# Patient Record
Sex: Male | Born: 1950 | Race: Black or African American | Hispanic: No | Marital: Single | Smoking: Current every day smoker
Health system: Southern US, Community
[De-identification: ages and names within clinical notes are randomized; demographics above are authoritative.]

## PROBLEM LIST (undated history)

## (undated) ENCOUNTER — Emergency Department: Payer: Medicare Other

## (undated) DIAGNOSIS — F1010 Alcohol abuse, uncomplicated: Secondary | ICD-10-CM

## (undated) DIAGNOSIS — K759 Inflammatory liver disease, unspecified: Secondary | ICD-10-CM

## (undated) DIAGNOSIS — I10 Essential (primary) hypertension: Secondary | ICD-10-CM

## (undated) HISTORY — PX: HIP SURGERY: SHX245

---

## 2008-12-07 ENCOUNTER — Emergency Department: Payer: Self-pay | Admitting: Emergency Medicine

## 2009-09-04 ENCOUNTER — Emergency Department: Payer: Self-pay | Admitting: Emergency Medicine

## 2010-05-24 ENCOUNTER — Emergency Department: Payer: Self-pay | Admitting: Emergency Medicine

## 2010-10-04 ENCOUNTER — Emergency Department: Payer: Self-pay | Admitting: Internal Medicine

## 2011-02-08 LAB — URINALYSIS, COMPLETE
Bilirubin,UR: NEGATIVE
Blood: NEGATIVE
Leukocyte Esterase: NEGATIVE
Nitrite: NEGATIVE
Protein: NEGATIVE
RBC,UR: NONE SEEN /HPF (ref 0–5)
Specific Gravity: 1.002 (ref 1.003–1.030)
Squamous Epithelial: <1
WBC UR: <1 /HPF (ref 0–5)

## 2011-02-08 LAB — DRUG SCREEN, URINE
Amphetamines, Ur Screen: NEGATIVE (ref ?–1000)
Benzodiazepine, Ur Scrn: NEGATIVE (ref ?–200)
Cannabinoid 50 Ng, Ur ~~LOC~~: NEGATIVE (ref ?–50)
Methadone, Ur Screen: NEGATIVE (ref ?–300)
Phencyclidine (PCP) Ur S: NEGATIVE (ref ?–25)
Tricyclic, Ur Screen: NEGATIVE (ref ?–1000)

## 2011-02-08 LAB — COMPREHENSIVE METABOLIC PANEL
Alkaline Phosphatase: 48 U/L — ABNORMAL LOW (ref 50–136)
Anion Gap: 12 (ref 7–16)
BUN: 8 mg/dL (ref 7–18)
Bilirubin,Total: 0.2 mg/dL (ref 0.2–1.0)
Calcium, Total: 8.4 mg/dL — ABNORMAL LOW (ref 8.5–10.1)
Chloride: 106 mmol/L (ref 98–107)
Co2: 25 mmol/L (ref 21–32)
EGFR (African American): >60
EGFR (Non-African Amer.): >60
Glucose: 110 mg/dL — ABNORMAL HIGH (ref 65–99)
Potassium: 4.4 mmol/L (ref 3.5–5.1)
SGOT(AST): 36 U/L (ref 15–37)
SGPT (ALT): 35 U/L

## 2011-02-08 LAB — ACETAMINOPHEN LEVEL: Acetaminophen: <2 ug/mL

## 2011-02-08 LAB — ETHANOL
Ethanol %: 0.371 % (ref 0.000–0.080)
Ethanol: 371 mg/dL
Ethanol: 259 mg/dL

## 2011-02-08 LAB — CBC
HCT: 41.9 % (ref 40.0–52.0)
HGB: 13.8 g/dL (ref 13.0–18.0)
MCH: 33.4 pg (ref 26.0–34.0)
Platelet: 316 10*3/uL (ref 150–440)
RBC: 4.12 10*6/uL — ABNORMAL LOW (ref 4.40–5.90)
WBC: 5.2 10*3/uL (ref 3.8–10.6)

## 2011-02-08 LAB — TSH: Thyroid Stimulating Horm: 0.71 u[IU]/mL

## 2011-02-09 ENCOUNTER — Inpatient Hospital Stay: Payer: Self-pay | Admitting: Psychiatry

## 2011-03-18 ENCOUNTER — Emergency Department: Payer: Self-pay | Admitting: Emergency Medicine

## 2011-03-18 LAB — COMPREHENSIVE METABOLIC PANEL
Albumin: 4.1 g/dL (ref 3.4–5.0)
Alkaline Phosphatase: 62 U/L (ref 50–136)
Calcium, Total: 8.8 mg/dL (ref 8.5–10.1)
Chloride: 105 mmol/L (ref 98–107)
Co2: 24 mmol/L (ref 21–32)
EGFR (Non-African Amer.): >60
Osmolality: 285 (ref 275–301)
Potassium: 4.4 mmol/L (ref 3.5–5.1)
SGOT(AST): 47 U/L — ABNORMAL HIGH (ref 15–37)
SGPT (ALT): 41 U/L
Total Protein: 8.4 g/dL — ABNORMAL HIGH (ref 6.4–8.2)

## 2011-03-18 LAB — DRUG SCREEN, URINE
Barbiturates, Ur Screen: NEGATIVE (ref ?–200)
Benzodiazepine, Ur Scrn: NEGATIVE (ref ?–200)
MDMA (Ecstasy)Ur Screen: NEGATIVE (ref ?–500)
Methadone, Ur Screen: NEGATIVE (ref ?–300)
Phencyclidine (PCP) Ur S: NEGATIVE (ref ?–25)

## 2011-03-18 LAB — ACETAMINOPHEN LEVEL: Acetaminophen: <2 ug/mL

## 2011-03-18 LAB — CBC
MCH: 32.2 pg (ref 26.0–34.0)
MCHC: 33.3 g/dL (ref 32.0–36.0)
MCV: 97 fL (ref 80–100)
Platelet: 277 10*3/uL (ref 150–440)
RDW: 13.9 % (ref 11.5–14.5)
WBC: 10.8 10*3/uL — ABNORMAL HIGH (ref 3.8–10.6)

## 2011-03-18 LAB — SALICYLATE LEVEL: Salicylates, Serum: 2 mg/dL

## 2011-03-18 LAB — URINALYSIS, COMPLETE
Bilirubin,UR: NEGATIVE
Blood: NEGATIVE
Hyaline Cast: 3
Ketone: NEGATIVE
Nitrite: NEGATIVE
Ph: 5 (ref 4.5–8.0)
Protein: NEGATIVE
RBC,UR: NONE SEEN /HPF (ref 0–5)
Squamous Epithelial: 1

## 2011-03-18 LAB — ETHANOL
Ethanol %: 0.358 % (ref 0.000–0.080)
Ethanol: 287 mg/dL

## 2012-04-14 ENCOUNTER — Emergency Department: Payer: Self-pay | Admitting: Internal Medicine

## 2012-04-14 LAB — BASIC METABOLIC PANEL
Anion Gap: 9 (ref 7–16)
BUN: 11 mg/dL (ref 7–18)
Co2: 23 mmol/L (ref 21–32)
EGFR (African American): >60
Glucose: 122 mg/dL — ABNORMAL HIGH (ref 65–99)
Potassium: 4 mmol/L (ref 3.5–5.1)
Sodium: 137 mmol/L (ref 136–145)

## 2012-04-14 LAB — DRUG SCREEN, URINE
Amphetamines, Ur Screen: NEGATIVE (ref ?–1000)
Cannabinoid 50 Ng, Ur ~~LOC~~: POSITIVE (ref ?–50)
Cocaine Metabolite,Ur ~~LOC~~: POSITIVE (ref ?–300)
MDMA (Ecstasy)Ur Screen: NEGATIVE (ref ?–500)
Opiate, Ur Screen: NEGATIVE (ref ?–300)
Phencyclidine (PCP) Ur S: NEGATIVE (ref ?–25)
Tricyclic, Ur Screen: NEGATIVE (ref ?–1000)

## 2012-04-14 LAB — CBC
HGB: 13.4 g/dL (ref 13.0–18.0)
MCH: 32.5 pg (ref 26.0–34.0)
MCHC: 33.6 g/dL (ref 32.0–36.0)
MCV: 97 fL (ref 80–100)
RBC: 4.13 10*6/uL — ABNORMAL LOW (ref 4.40–5.90)
RDW: 15.3 % — ABNORMAL HIGH (ref 11.5–14.5)
WBC: 4.8 10*3/uL (ref 3.8–10.6)

## 2012-04-14 LAB — ETHANOL
Ethanol %: 0.309 % (ref 0.000–0.080)
Ethanol: 309 mg/dL

## 2012-06-07 ENCOUNTER — Emergency Department: Payer: Self-pay | Admitting: Emergency Medicine

## 2012-06-07 LAB — COMPREHENSIVE METABOLIC PANEL
Alkaline Phosphatase: 76 U/L (ref 50–136)
BUN: 9 mg/dL (ref 7–18)
Bilirubin,Total: 0.2 mg/dL (ref 0.2–1.0)
Calcium, Total: 8.5 mg/dL (ref 8.5–10.1)
Chloride: 105 mmol/L (ref 98–107)
Creatinine: 0.68 mg/dL (ref 0.60–1.30)
EGFR (African American): >60
Glucose: 106 mg/dL — ABNORMAL HIGH (ref 65–99)
Osmolality: 271 (ref 275–301)
Potassium: 4.3 mmol/L (ref 3.5–5.1)
SGOT(AST): 72 U/L — ABNORMAL HIGH (ref 15–37)
SGPT (ALT): 48 U/L (ref 12–78)
Sodium: 136 mmol/L (ref 136–145)
Total Protein: 8.7 g/dL — ABNORMAL HIGH (ref 6.4–8.2)

## 2012-06-07 LAB — TSH: Thyroid Stimulating Horm: 0.87 u[IU]/mL

## 2012-06-07 LAB — CBC
HCT: 39.7 % — ABNORMAL LOW (ref 40.0–52.0)
HGB: 13.3 g/dL (ref 13.0–18.0)
MCH: 32.2 pg (ref 26.0–34.0)
Platelet: 256 10*3/uL (ref 150–440)
RBC: 4.13 10*6/uL — ABNORMAL LOW (ref 4.40–5.90)

## 2012-09-12 ENCOUNTER — Emergency Department: Payer: Self-pay | Admitting: Emergency Medicine

## 2012-09-12 LAB — COMPREHENSIVE METABOLIC PANEL
BUN: 7 mg/dL (ref 7–18)
Bilirubin,Total: 0.3 mg/dL (ref 0.2–1.0)
Calcium, Total: 8.2 mg/dL — ABNORMAL LOW (ref 8.5–10.1)
Chloride: 107 mmol/L (ref 98–107)
Co2: 24 mmol/L (ref 21–32)
Creatinine: 0.71 mg/dL (ref 0.60–1.30)
Glucose: 99 mg/dL (ref 65–99)
Potassium: 3.6 mmol/L (ref 3.5–5.1)
SGOT(AST): 125 U/L — ABNORMAL HIGH (ref 15–37)
SGPT (ALT): 89 U/L — ABNORMAL HIGH (ref 12–78)
Sodium: 138 mmol/L (ref 136–145)

## 2012-09-12 LAB — CBC
HGB: 12.9 g/dL — ABNORMAL LOW (ref 13.0–18.0)
MCH: 33.5 pg (ref 26.0–34.0)
MCV: 99 fL (ref 80–100)
Platelet: 186 10*3/uL (ref 150–440)
RBC: 3.85 10*6/uL — ABNORMAL LOW (ref 4.40–5.90)
RDW: 14.8 % — ABNORMAL HIGH (ref 11.5–14.5)

## 2012-09-12 LAB — ETHANOL: Ethanol: 275 mg/dL

## 2012-09-18 ENCOUNTER — Emergency Department: Payer: Self-pay | Admitting: Emergency Medicine

## 2012-09-18 LAB — URINALYSIS, COMPLETE
Glucose,UR: NEGATIVE mg/dL (ref 0–75)
Ketone: NEGATIVE
Nitrite: NEGATIVE
Protein: NEGATIVE
Specific Gravity: 1.005 (ref 1.003–1.030)
WBC UR: 5 /HPF (ref 0–5)

## 2012-09-18 LAB — COMPREHENSIVE METABOLIC PANEL
Alkaline Phosphatase: 55 U/L (ref 50–136)
Anion Gap: 8 (ref 7–16)
BUN: 4 mg/dL — ABNORMAL LOW (ref 7–18)
Bilirubin,Total: 0.6 mg/dL (ref 0.2–1.0)
Calcium, Total: 8.7 mg/dL (ref 8.5–10.1)
Co2: 24 mmol/L (ref 21–32)
EGFR (African American): >60
EGFR (Non-African Amer.): >60
Glucose: 87 mg/dL (ref 65–99)
SGPT (ALT): 52 U/L (ref 12–78)
Total Protein: 8.1 g/dL (ref 6.4–8.2)

## 2012-09-18 LAB — DRUG SCREEN, URINE
Amphetamines, Ur Screen: NEGATIVE (ref ?–1000)
Barbiturates, Ur Screen: NEGATIVE (ref ?–200)
Benzodiazepine, Ur Scrn: NEGATIVE (ref ?–200)
Cocaine Metabolite,Ur ~~LOC~~: POSITIVE (ref ?–300)
MDMA (Ecstasy)Ur Screen: NEGATIVE (ref ?–500)
Methadone, Ur Screen: NEGATIVE (ref ?–300)
Opiate, Ur Screen: NEGATIVE (ref ?–300)
Tricyclic, Ur Screen: NEGATIVE (ref ?–1000)

## 2012-09-18 LAB — CBC
HCT: 35.2 % — ABNORMAL LOW (ref 40.0–52.0)
Platelet: 239 10*3/uL (ref 150–440)
WBC: 5 10*3/uL (ref 3.8–10.6)

## 2012-09-18 LAB — TSH: Thyroid Stimulating Horm: 0.355 u[IU]/mL — ABNORMAL LOW

## 2012-09-18 LAB — ETHANOL
Ethanol %: 0.08 % (ref 0.000–0.080)
Ethanol: 80 mg/dL

## 2012-10-15 ENCOUNTER — Emergency Department: Payer: Self-pay | Admitting: Emergency Medicine

## 2012-10-15 LAB — URINALYSIS, COMPLETE
Bilirubin,UR: NEGATIVE
Blood: NEGATIVE
Glucose,UR: NEGATIVE mg/dL (ref 0–75)
Ketone: NEGATIVE
Nitrite: NEGATIVE
Ph: 6 (ref 4.5–8.0)
Protein: NEGATIVE
RBC,UR: NONE SEEN /HPF (ref 0–5)
Specific Gravity: 1.004 (ref 1.003–1.030)
Squamous Epithelial: <1
WBC UR: 6 /HPF (ref 0–5)

## 2012-10-15 LAB — COMPREHENSIVE METABOLIC PANEL
Albumin: 3.6 g/dL (ref 3.4–5.0)
Alkaline Phosphatase: 76 U/L (ref 50–136)
Anion Gap: 7 (ref 7–16)
Bilirubin,Total: 0.4 mg/dL (ref 0.2–1.0)
Calcium, Total: 8.9 mg/dL (ref 8.5–10.1)
Creatinine: 0.62 mg/dL (ref 0.60–1.30)
EGFR (Non-African Amer.): >60
Glucose: 86 mg/dL (ref 65–99)
Osmolality: 274 (ref 275–301)
Potassium: 4 mmol/L (ref 3.5–5.1)
SGOT(AST): 38 U/L — ABNORMAL HIGH (ref 15–37)
SGPT (ALT): 34 U/L (ref 12–78)

## 2012-10-15 LAB — CBC
HCT: 39.6 % — ABNORMAL LOW (ref 40.0–52.0)
HGB: 13.3 g/dL (ref 13.0–18.0)
MCH: 33.1 pg (ref 26.0–34.0)
MCHC: 33.7 g/dL (ref 32.0–36.0)
MCV: 98 fL (ref 80–100)
Platelet: 231 10*3/uL (ref 150–440)
RBC: 4.03 10*6/uL — ABNORMAL LOW (ref 4.40–5.90)
RDW: 13.8 % (ref 11.5–14.5)
WBC: 5.2 10*3/uL (ref 3.8–10.6)

## 2012-10-15 LAB — DRUG SCREEN, URINE
Barbiturates, Ur Screen: NEGATIVE (ref ?–200)
Benzodiazepine, Ur Scrn: NEGATIVE (ref ?–200)
Cocaine Metabolite,Ur ~~LOC~~: NEGATIVE (ref ?–300)
MDMA (Ecstasy)Ur Screen: NEGATIVE (ref ?–500)
Methadone, Ur Screen: NEGATIVE (ref ?–300)
Opiate, Ur Screen: NEGATIVE (ref ?–300)
Phencyclidine (PCP) Ur S: NEGATIVE (ref ?–25)
Tricyclic, Ur Screen: NEGATIVE (ref ?–1000)

## 2012-10-15 LAB — ETHANOL
Ethanol %: 0.087 % — ABNORMAL HIGH (ref 0.000–0.080)
Ethanol: 87 mg/dL

## 2012-10-15 LAB — TSH: Thyroid Stimulating Horm: 0.557 u[IU]/mL

## 2013-04-22 ENCOUNTER — Inpatient Hospital Stay: Payer: Self-pay | Admitting: Internal Medicine

## 2013-04-22 LAB — URINALYSIS, COMPLETE
Bilirubin,UR: NEGATIVE
Blood: NEGATIVE
Glucose,UR: NEGATIVE mg/dL (ref 0–75)
KETONE: NEGATIVE
Leukocyte Esterase: NEGATIVE
Nitrite: NEGATIVE
Ph: 5 (ref 4.5–8.0)
Protein: NEGATIVE
Specific Gravity: 1.009 (ref 1.003–1.030)
WBC UR: 4 /HPF (ref 0–5)

## 2013-04-22 LAB — COMPREHENSIVE METABOLIC PANEL
ANION GAP: 6 — AB (ref 7–16)
AST: 68 U/L — AB (ref 15–37)
Albumin: 3.4 g/dL (ref 3.4–5.0)
Alkaline Phosphatase: 72 U/L
BILIRUBIN TOTAL: 0.3 mg/dL (ref 0.2–1.0)
BUN: 8 mg/dL (ref 7–18)
CALCIUM: 8.2 mg/dL — AB (ref 8.5–10.1)
CREATININE: 0.9 mg/dL (ref 0.60–1.30)
Chloride: 106 mmol/L (ref 98–107)
Co2: 25 mmol/L (ref 21–32)
EGFR (Non-African Amer.): >60
Glucose: 96 mg/dL (ref 65–99)
OSMOLALITY: 272 (ref 275–301)
Potassium: 3.8 mmol/L (ref 3.5–5.1)
SGPT (ALT): 46 U/L (ref 12–78)
Sodium: 137 mmol/L (ref 136–145)
TOTAL PROTEIN: 8.5 g/dL — AB (ref 6.4–8.2)

## 2013-04-22 LAB — DRUG SCREEN, URINE
AMPHETAMINES, UR SCREEN: NEGATIVE (ref ?–1000)
BARBITURATES, UR SCREEN: NEGATIVE (ref ?–200)
BENZODIAZEPINE, UR SCRN: NEGATIVE (ref ?–200)
CANNABINOID 50 NG, UR ~~LOC~~: NEGATIVE (ref ?–50)
Cocaine Metabolite,Ur ~~LOC~~: POSITIVE (ref ?–300)
MDMA (ECSTASY) UR SCREEN: NEGATIVE (ref ?–500)
METHADONE, UR SCREEN: NEGATIVE (ref ?–300)
OPIATE, UR SCREEN: NEGATIVE (ref ?–300)
Phencyclidine (PCP) Ur S: NEGATIVE (ref ?–25)
TRICYCLIC, UR SCREEN: NEGATIVE (ref ?–1000)

## 2013-04-22 LAB — ETHANOL
ETHANOL %: 0.435 % — AB (ref 0.000–0.080)
Ethanol: 435 mg/dL

## 2013-04-22 LAB — CBC
HCT: 41.1 % (ref 40.0–52.0)
HGB: 12.8 g/dL — ABNORMAL LOW (ref 13.0–18.0)
MCH: 30.5 pg (ref 26.0–34.0)
MCHC: 31.1 g/dL — ABNORMAL LOW (ref 32.0–36.0)
MCV: 98 fL (ref 80–100)
PLATELETS: 236 10*3/uL (ref 150–440)
RBC: 4.19 10*6/uL — ABNORMAL LOW (ref 4.40–5.90)
RDW: 14.5 % (ref 11.5–14.5)
WBC: 6 10*3/uL (ref 3.8–10.6)

## 2013-04-22 LAB — TROPONIN I: Troponin-I: <0.02 ng/mL

## 2013-04-23 LAB — DRUG SCREEN, URINE
Amphetamines, Ur Screen: NEGATIVE (ref ?–1000)
BENZODIAZEPINE, UR SCRN: NEGATIVE (ref ?–200)
Barbiturates, Ur Screen: NEGATIVE (ref ?–200)
CANNABINOID 50 NG, UR ~~LOC~~: NEGATIVE (ref ?–50)
Cocaine Metabolite,Ur ~~LOC~~: POSITIVE (ref ?–300)
MDMA (Ecstasy)Ur Screen: NEGATIVE (ref ?–500)
Methadone, Ur Screen: NEGATIVE (ref ?–300)
Opiate, Ur Screen: POSITIVE (ref ?–300)
Phencyclidine (PCP) Ur S: NEGATIVE (ref ?–25)
TRICYCLIC, UR SCREEN: NEGATIVE (ref ?–1000)

## 2013-04-23 LAB — BASIC METABOLIC PANEL
Anion Gap: 8 (ref 7–16)
BUN: 7 mg/dL (ref 7–18)
CALCIUM: 7.4 mg/dL — AB (ref 8.5–10.1)
CO2: 20 mmol/L — AB (ref 21–32)
CREATININE: 0.72 mg/dL (ref 0.60–1.30)
Chloride: 109 mmol/L — ABNORMAL HIGH (ref 98–107)
EGFR (African American): >60
EGFR (Non-African Amer.): >60
GLUCOSE: 113 mg/dL — AB (ref 65–99)
Osmolality: 273 (ref 275–301)
Potassium: 3.9 mmol/L (ref 3.5–5.1)
Sodium: 137 mmol/L (ref 136–145)

## 2013-04-23 LAB — CBC WITH DIFFERENTIAL/PLATELET
Basophil #: 0 10*3/uL (ref 0.0–0.1)
Basophil %: 0.3 %
EOS ABS: 0 10*3/uL (ref 0.0–0.7)
Eosinophil %: 0 %
HCT: 33.3 % — AB (ref 40.0–52.0)
HGB: 11.1 g/dL — AB (ref 13.0–18.0)
Lymphocyte #: 1 10*3/uL (ref 1.0–3.6)
Lymphocyte %: 14.2 %
MCH: 32.1 pg (ref 26.0–34.0)
MCHC: 33.3 g/dL (ref 32.0–36.0)
MCV: 96 fL (ref 80–100)
Monocyte #: 0.6 x10 3/mm (ref 0.2–1.0)
Monocyte %: 8.4 %
NEUTROS ABS: 5.6 10*3/uL (ref 1.4–6.5)
Neutrophil %: 77.1 %
Platelet: 235 10*3/uL (ref 150–440)
RBC: 3.46 10*6/uL — AB (ref 4.40–5.90)
RDW: 14.6 % — ABNORMAL HIGH (ref 11.5–14.5)
WBC: 7.2 10*3/uL (ref 3.8–10.6)

## 2013-04-23 LAB — MAGNESIUM: Magnesium: 1.7 mg/dL — ABNORMAL LOW

## 2013-04-25 LAB — CBC WITH DIFFERENTIAL/PLATELET
Basophil #: 0 10*3/uL (ref 0.0–0.1)
Basophil %: 0.2 %
EOS PCT: 0.1 %
Eosinophil #: 0 10*3/uL (ref 0.0–0.7)
HCT: 29.2 % — ABNORMAL LOW (ref 40.0–52.0)
HGB: 9.9 g/dL — ABNORMAL LOW (ref 13.0–18.0)
LYMPHS PCT: 14.3 %
Lymphocyte #: 1.6 10*3/uL (ref 1.0–3.6)
MCH: 32.6 pg (ref 26.0–34.0)
MCHC: 33.8 g/dL (ref 32.0–36.0)
MCV: 97 fL (ref 80–100)
MONO ABS: 1.5 x10 3/mm — AB (ref 0.2–1.0)
Monocyte %: 13 %
Neutrophil #: 8.2 10*3/uL — ABNORMAL HIGH (ref 1.4–6.5)
Neutrophil %: 72.4 %
PLATELETS: 181 10*3/uL (ref 150–440)
RBC: 3.03 10*6/uL — ABNORMAL LOW (ref 4.40–5.90)
RDW: 14.3 % (ref 11.5–14.5)
WBC: 11.4 10*3/uL — AB (ref 3.8–10.6)

## 2013-04-25 LAB — COMPREHENSIVE METABOLIC PANEL
ANION GAP: 8 (ref 7–16)
Albumin: 3 g/dL — ABNORMAL LOW (ref 3.4–5.0)
Alkaline Phosphatase: 54 U/L
BUN: 7 mg/dL (ref 7–18)
Bilirubin,Total: 1.7 mg/dL — ABNORMAL HIGH (ref 0.2–1.0)
CHLORIDE: 102 mmol/L (ref 98–107)
CO2: 23 mmol/L (ref 21–32)
Calcium, Total: 8 mg/dL — ABNORMAL LOW (ref 8.5–10.1)
Creatinine: 0.71 mg/dL (ref 0.60–1.30)
EGFR (Non-African Amer.): >60
GLUCOSE: 111 mg/dL — AB (ref 65–99)
Osmolality: 265 (ref 275–301)
POTASSIUM: 3.3 mmol/L — AB (ref 3.5–5.1)
SGOT(AST): 39 U/L — ABNORMAL HIGH (ref 15–37)
SGPT (ALT): 29 U/L (ref 12–78)
Sodium: 133 mmol/L — ABNORMAL LOW (ref 136–145)
Total Protein: 7.5 g/dL (ref 6.4–8.2)

## 2013-04-25 LAB — DRUG SCREEN, URINE
Amphetamines, Ur Screen: NEGATIVE (ref ?–1000)
Barbiturates, Ur Screen: NEGATIVE (ref ?–200)
Benzodiazepine, Ur Scrn: POSITIVE (ref ?–200)
CANNABINOID 50 NG, UR ~~LOC~~: NEGATIVE (ref ?–50)
Cocaine Metabolite,Ur ~~LOC~~: NEGATIVE (ref ?–300)
MDMA (Ecstasy)Ur Screen: NEGATIVE (ref ?–500)
METHADONE, UR SCREEN: NEGATIVE (ref ?–300)
Opiate, Ur Screen: POSITIVE (ref ?–300)
Phencyclidine (PCP) Ur S: NEGATIVE (ref ?–25)
TRICYCLIC, UR SCREEN: NEGATIVE (ref ?–1000)

## 2013-04-26 LAB — COMPREHENSIVE METABOLIC PANEL
ANION GAP: 8 (ref 7–16)
Albumin: 2.4 g/dL — ABNORMAL LOW (ref 3.4–5.0)
Alkaline Phosphatase: 46 U/L
BUN: 8 mg/dL (ref 7–18)
Bilirubin,Total: 1.4 mg/dL — ABNORMAL HIGH (ref 0.2–1.0)
Calcium, Total: 7.9 mg/dL — ABNORMAL LOW (ref 8.5–10.1)
Chloride: 104 mmol/L (ref 98–107)
Co2: 24 mmol/L (ref 21–32)
Creatinine: 0.62 mg/dL (ref 0.60–1.30)
EGFR (Non-African Amer.): >60
Glucose: 96 mg/dL (ref 65–99)
Osmolality: 270 (ref 275–301)
Potassium: 3.1 mmol/L — ABNORMAL LOW (ref 3.5–5.1)
SGOT(AST): 24 U/L (ref 15–37)
SGPT (ALT): 19 U/L (ref 12–78)
Sodium: 136 mmol/L (ref 136–145)
Total Protein: 6.6 g/dL (ref 6.4–8.2)

## 2013-04-26 LAB — CBC WITH DIFFERENTIAL/PLATELET
BASOS PCT: 0.4 %
Basophil #: 0 10*3/uL (ref 0.0–0.1)
EOS PCT: 0.7 %
Eosinophil #: 0.1 10*3/uL (ref 0.0–0.7)
HCT: 25.8 % — ABNORMAL LOW (ref 40.0–52.0)
HGB: 8.7 g/dL — AB (ref 13.0–18.0)
Lymphocyte #: 1.7 10*3/uL (ref 1.0–3.6)
Lymphocyte %: 20 %
MCH: 32.7 pg (ref 26.0–34.0)
MCHC: 33.6 g/dL (ref 32.0–36.0)
MCV: 97 fL (ref 80–100)
MONO ABS: 1.2 x10 3/mm — AB (ref 0.2–1.0)
Monocyte %: 13.4 %
NEUTROS ABS: 5.6 10*3/uL (ref 1.4–6.5)
NEUTROS PCT: 65.5 %
Platelet: 185 10*3/uL (ref 150–440)
RBC: 2.65 10*6/uL — ABNORMAL LOW (ref 4.40–5.90)
RDW: 14.2 % (ref 11.5–14.5)
WBC: 8.6 10*3/uL (ref 3.8–10.6)

## 2013-04-27 LAB — BASIC METABOLIC PANEL
ANION GAP: 5 — AB (ref 7–16)
BUN: 6 mg/dL — ABNORMAL LOW (ref 7–18)
CO2: 26 mmol/L (ref 21–32)
Calcium, Total: 7.8 mg/dL — ABNORMAL LOW (ref 8.5–10.1)
Chloride: 103 mmol/L (ref 98–107)
Creatinine: 0.64 mg/dL (ref 0.60–1.30)
EGFR (African American): >60
Glucose: 106 mg/dL — ABNORMAL HIGH (ref 65–99)
Osmolality: 266 (ref 275–301)
Potassium: 3.3 mmol/L — ABNORMAL LOW (ref 3.5–5.1)
Sodium: 134 mmol/L — ABNORMAL LOW (ref 136–145)

## 2013-04-27 LAB — CBC WITH DIFFERENTIAL/PLATELET
BASOS ABS: 0 10*3/uL (ref 0.0–0.1)
BASOS PCT: 0.2 %
Eosinophil #: 0.1 10*3/uL (ref 0.0–0.7)
Eosinophil %: 1.2 %
HCT: 25.9 % — ABNORMAL LOW (ref 40.0–52.0)
HGB: 8.7 g/dL — ABNORMAL LOW (ref 13.0–18.0)
Lymphocyte #: 1 10*3/uL (ref 1.0–3.6)
Lymphocyte %: 14 %
MCH: 32.7 pg (ref 26.0–34.0)
MCHC: 33.6 g/dL (ref 32.0–36.0)
MCV: 98 fL (ref 80–100)
Monocyte #: 0.9 x10 3/mm (ref 0.2–1.0)
Monocyte %: 12.1 %
Neutrophil #: 5.1 10*3/uL (ref 1.4–6.5)
Neutrophil %: 72.5 %
Platelet: 210 10*3/uL (ref 150–440)
RBC: 2.65 10*6/uL — AB (ref 4.40–5.90)
RDW: 14.1 % (ref 11.5–14.5)
WBC: 7.1 10*3/uL (ref 3.8–10.6)

## 2013-04-28 LAB — BASIC METABOLIC PANEL
Anion Gap: 7 (ref 7–16)
BUN: 9 mg/dL (ref 7–18)
CALCIUM: 8.2 mg/dL — AB (ref 8.5–10.1)
CHLORIDE: 101 mmol/L (ref 98–107)
CREATININE: 0.62 mg/dL (ref 0.60–1.30)
Co2: 25 mmol/L (ref 21–32)
EGFR (African American): >60
EGFR (Non-African Amer.): >60
Glucose: 109 mg/dL — ABNORMAL HIGH (ref 65–99)
Osmolality: 266 (ref 275–301)
POTASSIUM: 3.6 mmol/L (ref 3.5–5.1)
Sodium: 133 mmol/L — ABNORMAL LOW (ref 136–145)

## 2013-04-28 LAB — DRUG SCREEN, URINE
Amphetamines, Ur Screen: NEGATIVE (ref ?–1000)
BARBITURATES, UR SCREEN: NEGATIVE (ref ?–200)
Benzodiazepine, Ur Scrn: POSITIVE (ref ?–200)
CANNABINOID 50 NG, UR ~~LOC~~: NEGATIVE (ref ?–50)
Cocaine Metabolite,Ur ~~LOC~~: NEGATIVE (ref ?–300)
MDMA (Ecstasy)Ur Screen: NEGATIVE (ref ?–500)
METHADONE, UR SCREEN: NEGATIVE (ref ?–300)
OPIATE, UR SCREEN: POSITIVE (ref ?–300)
Phencyclidine (PCP) Ur S: NEGATIVE (ref ?–25)
Tricyclic, Ur Screen: NEGATIVE (ref ?–1000)

## 2013-04-28 LAB — CBC WITH DIFFERENTIAL/PLATELET
Basophil #: 0 10*3/uL (ref 0.0–0.1)
Basophil %: 0.3 %
EOS PCT: 0.8 %
Eosinophil #: 0.1 10*3/uL (ref 0.0–0.7)
HCT: 27.5 % — AB (ref 40.0–52.0)
HGB: 9.3 g/dL — ABNORMAL LOW (ref 13.0–18.0)
Lymphocyte #: 1.2 10*3/uL (ref 1.0–3.6)
Lymphocyte %: 17.6 %
MCH: 32.9 pg (ref 26.0–34.0)
MCHC: 34 g/dL (ref 32.0–36.0)
MCV: 97 fL (ref 80–100)
MONO ABS: 1.1 x10 3/mm — AB (ref 0.2–1.0)
MONOS PCT: 16 %
Neutrophil #: 4.4 10*3/uL (ref 1.4–6.5)
Neutrophil %: 65.3 %
Platelet: 236 10*3/uL (ref 150–440)
RBC: 2.83 10*6/uL — ABNORMAL LOW (ref 4.40–5.90)
RDW: 13.9 % (ref 11.5–14.5)
WBC: 6.7 10*3/uL (ref 3.8–10.6)

## 2013-04-29 LAB — CBC WITH DIFFERENTIAL/PLATELET
BASOS ABS: 0 10*3/uL (ref 0.0–0.1)
Basophil %: 0.5 %
Eosinophil #: 0 10*3/uL (ref 0.0–0.7)
Eosinophil %: 0.2 %
HCT: 26 % — AB (ref 40.0–52.0)
HGB: 8.9 g/dL — ABNORMAL LOW (ref 13.0–18.0)
Lymphocyte #: 0.9 10*3/uL — ABNORMAL LOW (ref 1.0–3.6)
Lymphocyte %: 10.7 %
MCH: 32.9 pg (ref 26.0–34.0)
MCHC: 34.3 g/dL (ref 32.0–36.0)
MCV: 96 fL (ref 80–100)
MONO ABS: 1.8 x10 3/mm — AB (ref 0.2–1.0)
MONOS PCT: 20.9 %
Neutrophil #: 5.9 10*3/uL (ref 1.4–6.5)
Neutrophil %: 67.7 %
Platelet: 280 10*3/uL (ref 150–440)
RBC: 2.72 10*6/uL — AB (ref 4.40–5.90)
RDW: 13.8 % (ref 11.5–14.5)
WBC: 8.7 10*3/uL (ref 3.8–10.6)

## 2013-04-29 LAB — BASIC METABOLIC PANEL
ANION GAP: 9 (ref 7–16)
BUN: 9 mg/dL (ref 7–18)
CALCIUM: 7.9 mg/dL — AB (ref 8.5–10.1)
Chloride: 96 mmol/L — ABNORMAL LOW (ref 98–107)
Co2: 25 mmol/L (ref 21–32)
Creatinine: 0.82 mg/dL (ref 0.60–1.30)
EGFR (African American): >60
EGFR (Non-African Amer.): >60
Glucose: 139 mg/dL — ABNORMAL HIGH (ref 65–99)
Osmolality: 262 (ref 275–301)
Potassium: 3.5 mmol/L (ref 3.5–5.1)
Sodium: 130 mmol/L — ABNORMAL LOW (ref 136–145)

## 2013-04-30 LAB — BASIC METABOLIC PANEL
Anion Gap: 7 (ref 7–16)
BUN: 15 mg/dL (ref 7–18)
CO2: 27 mmol/L (ref 21–32)
Calcium, Total: 8.1 mg/dL — ABNORMAL LOW (ref 8.5–10.1)
Chloride: 96 mmol/L — ABNORMAL LOW (ref 98–107)
Creatinine: 0.73 mg/dL (ref 0.60–1.30)
EGFR (African American): >60
Glucose: 103 mg/dL — ABNORMAL HIGH (ref 65–99)
Osmolality: 262 (ref 275–301)
POTASSIUM: 3.7 mmol/L (ref 3.5–5.1)
Sodium: 130 mmol/L — ABNORMAL LOW (ref 136–145)

## 2013-04-30 LAB — HEMOGLOBIN: HGB: 8.5 g/dL — ABNORMAL LOW (ref 13.0–18.0)

## 2013-05-03 LAB — SODIUM: Sodium: 130 mmol/L — ABNORMAL LOW (ref 136–145)

## 2013-05-03 LAB — HEMOGLOBIN: HGB: 7.9 g/dL — ABNORMAL LOW (ref 13.0–18.0)

## 2013-05-04 LAB — HEMOGLOBIN: HGB: 8.1 g/dL — AB (ref 13.0–18.0)

## 2013-05-04 LAB — CREATININE, SERUM
Creatinine: 0.71 mg/dL (ref 0.60–1.30)
EGFR (Non-African Amer.): >60

## 2013-05-05 LAB — URINALYSIS, COMPLETE
Bilirubin,UR: NEGATIVE
Blood: NEGATIVE
Glucose,UR: NEGATIVE mg/dL (ref 0–75)
KETONE: NEGATIVE
Leukocyte Esterase: NEGATIVE
Nitrite: NEGATIVE
PROTEIN: NEGATIVE
Ph: 6 (ref 4.5–8.0)
RBC,UR: NONE SEEN /HPF (ref 0–5)
SPECIFIC GRAVITY: 1.02 (ref 1.003–1.030)
Squamous Epithelial: 4

## 2013-05-05 LAB — HEMOGLOBIN: HGB: 8.3 g/dL — ABNORMAL LOW (ref 13.0–18.0)

## 2013-07-09 ENCOUNTER — Inpatient Hospital Stay: Payer: Self-pay | Admitting: Internal Medicine

## 2013-07-09 LAB — COMPREHENSIVE METABOLIC PANEL
ALK PHOS: 94 U/L
Albumin: 3.1 g/dL — ABNORMAL LOW (ref 3.4–5.0)
Anion Gap: 10 (ref 7–16)
BILIRUBIN TOTAL: 1.1 mg/dL — AB (ref 0.2–1.0)
BUN: 8 mg/dL (ref 7–18)
CREATININE: 0.7 mg/dL (ref 0.60–1.30)
Calcium, Total: 8.6 mg/dL (ref 8.5–10.1)
Chloride: 100 mmol/L (ref 98–107)
Co2: 25 mmol/L (ref 21–32)
EGFR (African American): >60
EGFR (Non-African Amer.): >60
Glucose: 127 mg/dL — ABNORMAL HIGH (ref 65–99)
Osmolality: 270 (ref 275–301)
POTASSIUM: 3.8 mmol/L (ref 3.5–5.1)
SGOT(AST): 53 U/L — ABNORMAL HIGH (ref 15–37)
SGPT (ALT): 23 U/L (ref 12–78)
Sodium: 135 mmol/L — ABNORMAL LOW (ref 136–145)
Total Protein: 7.8 g/dL (ref 6.4–8.2)

## 2013-07-09 LAB — CBC
HCT: 31.7 % — ABNORMAL LOW (ref 40.0–52.0)
HGB: 10.5 g/dL — ABNORMAL LOW (ref 13.0–18.0)
MCH: 30.8 pg (ref 26.0–34.0)
MCHC: 33.2 g/dL (ref 32.0–36.0)
MCV: 93 fL (ref 80–100)
Platelet: 416 10*3/uL (ref 150–440)
RBC: 3.41 10*6/uL — ABNORMAL LOW (ref 4.40–5.90)
RDW: 17.5 % — ABNORMAL HIGH (ref 11.5–14.5)
WBC: 10 10*3/uL (ref 3.8–10.6)

## 2013-07-09 LAB — URINALYSIS, COMPLETE
Bilirubin,UR: NEGATIVE
Blood: NEGATIVE
GLUCOSE, UR: NEGATIVE mg/dL (ref 0–75)
Hyaline Cast: 5
KETONE: NEGATIVE
Nitrite: NEGATIVE
Ph: 5 (ref 4.5–8.0)
Protein: NEGATIVE
RBC,UR: <1 /HPF (ref 0–5)
Specific Gravity: 1.015 (ref 1.003–1.030)
Squamous Epithelial: 2

## 2013-07-09 LAB — ETHANOL: Ethanol %: <0.003 % (ref 0.000–0.080)

## 2013-07-10 LAB — CBC WITH DIFFERENTIAL/PLATELET
BASOS PCT: 0.7 %
Basophil #: 0 10*3/uL (ref 0.0–0.1)
Eosinophil #: 0 10*3/uL (ref 0.0–0.7)
Eosinophil %: 0.1 %
HCT: 26.8 % — AB (ref 40.0–52.0)
HGB: 8.8 g/dL — ABNORMAL LOW (ref 13.0–18.0)
LYMPHS ABS: 1.5 10*3/uL (ref 1.0–3.6)
Lymphocyte %: 22.6 %
MCH: 30.7 pg (ref 26.0–34.0)
MCHC: 32.8 g/dL (ref 32.0–36.0)
MCV: 94 fL (ref 80–100)
Monocyte #: 1 x10 3/mm (ref 0.2–1.0)
Monocyte %: 15.3 %
NEUTROS ABS: 4.1 10*3/uL (ref 1.4–6.5)
Neutrophil %: 61.3 %
Platelet: 348 10*3/uL (ref 150–440)
RBC: 2.86 10*6/uL — ABNORMAL LOW (ref 4.40–5.90)
RDW: 17.2 % — AB (ref 11.5–14.5)
WBC: 6.7 10*3/uL (ref 3.8–10.6)

## 2013-07-10 LAB — DRUG SCREEN, URINE
Amphetamines, Ur Screen: NEGATIVE (ref ?–1000)
BARBITURATES, UR SCREEN: NEGATIVE (ref ?–200)
Benzodiazepine, Ur Scrn: NEGATIVE (ref ?–200)
Cannabinoid 50 Ng, Ur ~~LOC~~: POSITIVE (ref ?–50)
Cocaine Metabolite,Ur ~~LOC~~: NEGATIVE (ref ?–300)
MDMA (ECSTASY) UR SCREEN: NEGATIVE (ref ?–500)
METHADONE, UR SCREEN: NEGATIVE (ref ?–300)
OPIATE, UR SCREEN: NEGATIVE (ref ?–300)
PHENCYCLIDINE (PCP) UR S: NEGATIVE (ref ?–25)
TRICYCLIC, UR SCREEN: NEGATIVE (ref ?–1000)

## 2013-07-10 LAB — PROTIME-INR
INR: 1.2
PROTHROMBIN TIME: 15.3 s — AB (ref 11.5–14.7)

## 2013-07-10 LAB — APTT: Activated PTT: 26.5 secs (ref 23.6–35.9)

## 2013-07-11 LAB — CBC
HCT: 27.8 % — ABNORMAL LOW (ref 40.0–52.0)
HGB: 9 g/dL — ABNORMAL LOW (ref 13.0–18.0)
MCH: 30.6 pg (ref 26.0–34.0)
MCHC: 32.4 g/dL (ref 32.0–36.0)
MCV: 94 fL (ref 80–100)
PLATELETS: 274 10*3/uL (ref 150–440)
RBC: 2.95 10*6/uL — AB (ref 4.40–5.90)
RDW: 16.8 % — AB (ref 11.5–14.5)
WBC: 6 10*3/uL (ref 3.8–10.6)

## 2013-07-12 LAB — CBC WITH DIFFERENTIAL/PLATELET
BASOS PCT: 0.2 %
Basophil #: 0 10*3/uL (ref 0.0–0.1)
EOS ABS: 0 10*3/uL (ref 0.0–0.7)
EOS PCT: 0 %
HCT: 21.9 % — ABNORMAL LOW (ref 40.0–52.0)
HGB: 7.1 g/dL — ABNORMAL LOW (ref 13.0–18.0)
LYMPHS ABS: 0.5 10*3/uL — AB (ref 1.0–3.6)
LYMPHS PCT: 3.6 %
MCH: 30.1 pg (ref 26.0–34.0)
MCHC: 32.4 g/dL (ref 32.0–36.0)
MCV: 93 fL (ref 80–100)
MONO ABS: 0.7 x10 3/mm (ref 0.2–1.0)
MONOS PCT: 4.7 %
NEUTROS ABS: 12.8 10*3/uL — AB (ref 1.4–6.5)
Neutrophil %: 91.5 %
Platelet: 294 10*3/uL (ref 150–440)
RBC: 2.35 10*6/uL — ABNORMAL LOW (ref 4.40–5.90)
RDW: 16.7 % — AB (ref 11.5–14.5)
WBC: 13.9 10*3/uL — ABNORMAL HIGH (ref 3.8–10.6)

## 2013-07-12 LAB — BASIC METABOLIC PANEL
ANION GAP: 7 (ref 7–16)
BUN: 6 mg/dL — ABNORMAL LOW (ref 7–18)
CALCIUM: 7.9 mg/dL — AB (ref 8.5–10.1)
CHLORIDE: 100 mmol/L (ref 98–107)
CREATININE: 0.82 mg/dL (ref 0.60–1.30)
Co2: 26 mmol/L (ref 21–32)
EGFR (African American): >60
EGFR (Non-African Amer.): >60
Glucose: 124 mg/dL — ABNORMAL HIGH (ref 65–99)
Osmolality: 265 (ref 275–301)
Potassium: 4 mmol/L (ref 3.5–5.1)
SODIUM: 133 mmol/L — AB (ref 136–145)

## 2013-07-12 LAB — HEMOGLOBIN: HGB: 7.6 g/dL — AB (ref 13.0–18.0)

## 2013-07-13 LAB — BASIC METABOLIC PANEL
Anion Gap: 1 — ABNORMAL LOW (ref 7–16)
BUN: 7 mg/dL (ref 7–18)
Calcium, Total: 8 mg/dL — ABNORMAL LOW (ref 8.5–10.1)
Chloride: 104 mmol/L (ref 98–107)
Co2: 29 mmol/L (ref 21–32)
Creatinine: 0.65 mg/dL (ref 0.60–1.30)
Glucose: 82 mg/dL (ref 65–99)
OSMOLALITY: 265 (ref 275–301)
POTASSIUM: 3.4 mmol/L — AB (ref 3.5–5.1)
Sodium: 134 mmol/L — ABNORMAL LOW (ref 136–145)

## 2013-07-13 LAB — HEMOGLOBIN: HGB: 9.3 g/dL — AB (ref 13.0–18.0)

## 2013-07-14 LAB — BASIC METABOLIC PANEL
ANION GAP: 5 — AB (ref 7–16)
BUN: 8 mg/dL (ref 7–18)
CALCIUM: 8.3 mg/dL — AB (ref 8.5–10.1)
CHLORIDE: 100 mmol/L (ref 98–107)
Co2: 28 mmol/L (ref 21–32)
Creatinine: 0.51 mg/dL — ABNORMAL LOW (ref 0.60–1.30)
EGFR (African American): >60
EGFR (Non-African Amer.): >60
Glucose: 86 mg/dL (ref 65–99)
Osmolality: 264 (ref 275–301)
POTASSIUM: 3.8 mmol/L (ref 3.5–5.1)
Sodium: 133 mmol/L — ABNORMAL LOW (ref 136–145)

## 2013-07-14 LAB — HEMOGLOBIN: HGB: 9.6 g/dL — AB (ref 13.0–18.0)

## 2014-02-13 ENCOUNTER — Ambulatory Visit: Payer: Self-pay | Admitting: Gastroenterology

## 2014-02-23 ENCOUNTER — Ambulatory Visit: Payer: Self-pay | Admitting: Gastroenterology

## 2014-04-04 ENCOUNTER — Emergency Department: Payer: Self-pay | Admitting: Emergency Medicine

## 2014-05-30 NOTE — Discharge Summary (Signed)
PATIENT NAME:  Richard Graves, Richard Graves MR#:  [id number] DATE OF BIRTH:  Jan 25, 1951  DATE OF ADMISSION:  04/22/2013 DATE OF DISCHARGE:  05/05/2013  DISCHARGE DIAGNOSES: 1.  Left hip fracture.  2.  Delirium tremens, which now resolved.  3.  Postoperative anemia.  4.  Hyponatremia.  5.  Urinary tract infection status post treatment.  6.  Tobacco abuse.  7.  Cocaine abuse.  8.  Electrolyte abnormalities.  9.  Acute bronchospasm, now resolved.  PERTINENT LABS AND EVALUATIONS: Urinalysis: Nitrites negative, leukocytes negative. EKG shows sinus bradycardia with RSR or Q-R pattern suggestive of right ventricular conduction delay. TUDS were positive for cocaine. Urinalysis: 1+ bacteria. Alcohol level was 435 mg/dL. Troponin less than 0.02. LFTs were normal. Chest x-ray showed no cardiopulmonary process. CT scan of the head showed no acute abnormality. Left hip x-ray showed severely communicated left intertrochanteric fracture with longitudinal component extending into the proximal femur diatheses.   HOSPITAL COURSE: Please refer to the H and P done by the admitting physician. The patient is a 64 year old white male who fell on 03/17 and was admitted and was noted to have a hip fracture. The patient had to wait for cocaine to get out of his system because of his TUDS being positive. The patient underwent successful surgery on 03/23 without any complications. He has received physical therapy and he is doing well. He will need further rehab. He also had delirium tremens secondary to alcohol withdrawal. The patient's symptoms are now resolved. The patient did have some postoperative anemia and his hemoglobin is stabilized. He is on iron supplements. He was also noticed to have chronic hyponatremia possibly related to his alcohol abuse. This needs to be monitored. He was also noticed to have a UTI. That was treated. The patient at this point is doing much better and he will be discharged to rehab.   DISCHARGE  MEDICATIONS:  1.  Oxycodone 5 mg q. 4 p.r.n. for pain. 2.  MiraLax 17 grams daily as needed. 3.  Magnesium oxide 400 daily.  4.  Nicotine 21 mg transdermally daily. 5.  Iron sulfate 325 mg 1 tab p.o. b.i.d. 6.  Lovenox 40 mg subcu q. 24 x14 days then start aspirin 81 mg after that.  DIET: Regular.   ACTIVITY: As tolerated with PT. Follow with [name], orthopedics, as outpatient. Follow with the rehab doctor in 1 to 2 days.  TIME SPENT: 35 minutes.   ____________________________ Lacie ScottsShreyang H. Allena KatzPatel, MD shp:sb D: 05/05/2013 11:26:00 ET T: 05/05/2013 11:50:55 ET JOB#: [id number]  cc: Mendy Chou H. Allena KatzPatel, MD, <Dictator> Charise CarwinSHREYANG H Sarthak Rubenstein MD ELECTRONICALLY SIGNED 05/05/2013 12:55

## 2014-05-30 NOTE — Discharge Summary (Signed)
ADDENDUM   PATIENT NAME:  Richard ProfferWADE, [name] MR#:  [id number] DATE OF BIRTH:  03-06-1950  DATE OF ADMISSION:  04/22/2013 DATE OF DISCHARGE:  05/06/2013  The patient was admitted on 03/17 to 03/31. He had sustained a left hip fracture and underwent repair. He also suffered delirium tremens, which have now resolved. He was kept in the hospital and was awaiting a bed. There was no bed available on the 30th; therefore, he was discharged on the 31st. There were no medication changes. There were no changes to his hospital course. Please refer to the discharge done earlier for that discharge.   TOTAL TIME SPENT ON DISCHARGE: 35 minutes.  ____________________________ Lacie ScottsShreyang H. Allena KatzPatel, MD shp:aw D: 05/07/2013 09:04:21 ET T: 05/07/2013 09:09:37 ET JOB#: [id number]  cc: Jeani Fassnacht H. Allena KatzPatel, MD, <Dictator> Charise CarwinSHREYANG H Adore Kithcart MD ELECTRONICALLY SIGNED 05/16/2013 8:48

## 2014-05-30 NOTE — Consult Note (Signed)
Brief Consult Note: Diagnosis: comminuted proximal femur fracture.   Patient was seen by consultant.   Comments: will need to wait until 3/20 according to anesthesia because of cocaine use yesterday.  Electronic Signatures: Leitha SchullerMenz, Saurabh Hettich J (MD)  (Signed 18-Mar-15 16:09)  Authored: Brief Consult Note   Last Updated: 18-Mar-15 16:09 by Leitha SchullerMenz, Aaylah Pokorny J (MD)

## 2014-05-30 NOTE — Op Note (Signed)
PATIENT NAME:  Richard Graves, Richard Graves MR#:  [id number] DATE OF BIRTH:  1951/02/03  DATE OF PROCEDURE:  04/28/2013  PREOPERATIVE DIAGNOSIS: Left subtrochanteric intertrochanteric hip fracture.   POSTOPERATIVE DIAGNOSIS: Left subtrochanteric intertrochanteric hip fracture.   PROCEDURE: Open reduction internal fixation, left hip.   ANESTHESIA: Spinal.   SURGEON: Kennedy BuckerMichael Sydnei Ohaver, M.D.   DESCRIPTION OF PROCEDURE: The patient was brought to the operating room, and after adequate spinal anesthesia was obtained, he was placed on the fracture table with the right leg in the well-leg holder, left leg was placed in the traction boot with traction applied, and after getting C-arm to show AP and lateral images, initial position of the fracture was obtained. The hip was then prepped and draped using the barrier drape method. Appropriate patient identification and timeout procedures were completed.   A lateral incision was made and the IT band split. The vastus lateralis was elevated off this intermuscular septum, and a single cable and sleeve from the Synthes set was passed around the femur as a cerclage wire. This was tightened and crimped. It gave good reduction of an extended subtrochanteric fracture fragment, now converting it into essentially a two-part intertrochanteric fracture.   A proximal incision was made, a guidewire inserted down into the canal, and proximal reaming carried out. A long guidewire was then inserted. Reaming and then appropriate length rod was obtained. This was inserted down the canal to the appropriate depth and through the prior incision for the single wire. A lag screw was inserted over a guidewire. Traction was released and compression applied through the screw, giving acceptable alignment in both AP and lateral projections.   The proximal set screw was tightened and then loosening to allow for compression postoperatively.   Next, the insertion handle was removed and permanent proximal  images were saved. Going distally, a single distal interfragment interlocking screw was placed through the oblique hole, lining up the oblong hole , making a small incision, drilling, measuring, and placing a 5.0 cortical screw.   At this point, all wounds were thoroughly irrigated. The IT band and gluteal fascia were repaired using #1 Vicryl, 2-0 Vicryl subcutaneously, and skin staples. Xeroform, 4 x 4's, ABD, and tape applied. The patient was sent to the recovery room in stable condition.   ESTIMATED BLOOD LOSS: 500.   COMPLICATIONS: None.   SPECIMEN: None.   IMPLANTS: [employer]omet Affixus hip fracture nail, left, 130 degrees, 11 x 380 mm with a 105 mm lag screw, a 42 mm distal interlocking screw, and a Synthes 1.7 mm cable with crimp.    ____________________________ Leitha SchullerMichael J. Abrianna Sidman, MD mjm:np D: 04/28/2013 18:53:46 ET T: 04/28/2013 22:26:54 ET JOB#: [id number]  cc: Leitha SchullerMichael J. Jerimyah Vandunk, MD, <Dictator> Leitha SchullerMICHAEL J Fortune Brannigan MD ELECTRONICALLY SIGNED 04/29/2013 9:41

## 2014-05-30 NOTE — H&P (Signed)
PATIENT NAME:  Richard Graves, Richard Graves MR#:  [id number] DATE OF BIRTH:  08-11-1950  DATE OF ADMISSION:  04/22/2013  ADMITTING PHYSICIAN: Gladstone Lighter, MD  PRIMARY CARE PHYSICIAN: None.   CHIEF COMPLAINT: Fall and left hip fracture.   HISTORY OF PRESENT ILLNESS: Richard Graves is a 64 year old African American male with past medical history significant for alcohol abuse, smoking, and drug abuse. He presents after being intoxicated with alcohol and suffering a fall. He was complaining of left leg pain and was very nonspecific in his complaints because he was very intoxicated. Multiple x-rays were done to make sure he did not injure anything and one of the x-rays did reveal that he has a left hip fracture. He is being admitted for the same.   The patient is not able to provide any history. He is just cussing and seems to be in pain from his left hip. He is cocaine-positive on his drug screen and his alcohol level is greater than 435. He was admitted into the behavior medicine unit a couple of years ago, and it seems like he drinks 40 ounces of beer along with 1-1/2 pints of white liquor. He admitted to using cocaine frequently as well.   PAST MEDICAL HISTORY:  1.  Alcohol abuse.  2.  Drug abuse.  3.  Smoking.   PAST SURGICAL HISTORY: No known.   ALLERGIES: No known drug allergies.  OUTPATIENT MEDICATIONS: None.   SOCIAL HISTORY: Currently homeless. Continues to smoke, drink alcohol, and also drug abuse.   FAMILY HISTORY: Not known at this time.   REVIEW OF SYSTEMS: Unable to be obtained secondary to the patient's mental status.  PHYSICAL EXAMINATION:  VITAL SIGNS: Temperature 97.4 degrees Fahrenheit, pulse 57, respirations 18, blood pressure 139/87, pulse oximetry 98% on room air.  GENERAL: Very disheveled-appearing, well-built male lying in bed, restlessness secondary to  hip pain.  HEENT: Normocephalic, atraumatic. Pupils equal, round, reacting to light. Anicteric sclerae. Extraocular movements  intact. Oropharynx clear without erythema, mass or exudates. Smells of alcohol.  NECK: Supple. No thyromegaly, JVD or carotid bruits. No lymphadenopathy.  LUNGS: Moving air bilaterally. No wheeze or crackles. No use of accessory muscles for breathing.  CARDIOVASCULAR: S1, S2, regular rate and rhythm. No murmurs, rubs, or gallops.  ABDOMEN: Soft, nontender, nondistended. No hepatosplenomegaly. Normal bowel sounds.  EXTREMITIES: His left leg is abducted and externally rotated and very guarded. Right leg normal. Able to move that leg and normal dorsalis pedis pulses palpable bilaterally.  SKIN: No acne, rash or lesions.  LYMPHATICS: No cervical lymphadenopathy.  NEUROLOGIC: Able to move all other 3 extremities. Due to his intoxicated state, unable to participate in a full neurological exam. Sensation seems to be intact.  PSYCHOLOGICAL: Awake, alert, oriented x2 at this time.   LABORATORY DATA: WBC is 6.0. Hemoglobin 12.8, hematocrit 41.1, platelet count 230. Sodium 137, potassium 3.8, chloride 106, bicarbonate 25, BUN 8, creatinine 0.9, glucose 96 and calcium of 8.2. ALT 46, AST 68, AST 68, alk phos 72, total bilirubin 0.3, and albumin of 3.4. Troponin less than 0.02.   Alcohol level was 435. Urinalysis negative for any infection. Urine tox screen positive for cocaine.   Chest x-ray showing clear lung fields. No acute cardiopulmonary disease. Prior rib fractures are seen.   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   Left tibia-fibula showing acute fractures. Left hip is showing severely comminuted left intertrochanteric fracture  with longitudinal component extending into the proximal femoral diaphysis.   Pelvic x-ray showing severely comminuted left intertrochanteric fracture with a longitudinal component extending into the proximal femoral diaphysis.   EKG showing sinus  bradycardia, heart rate of 58, and left ventricular hypertrophy changes.   ASSESSMENT AND PLAN: This is a 64 year old male with alcohol abuse, drug abuse, and smoking, who is homeless, who was brought in after a fall and was noted to be alcohol intoxicated and also noted to have left hip fracture.  1.  Left hip fracture after fall from alcohol intoxication. Orthopedics has been consulted for surgery. Will need surgery; however, he is cocaine positive. Will repeat drug screen again tomorrow. No other contraindications for surgery. He should be low risk for surgery. Continue IV fluids and pain medications.  2.  Alcohol abuse and intoxication. Watch for withdrawal in the postoperative period. Started on CIWA protocol.  3.  Tobacco abuse. Started on nicotine patch in the hospital.  4.  Drug abuse.  5.  Counseling prior to discharge.   CODE STATUS: FULL CODE.   TIME SPENT ON ADMISSION: 50 minutes.   ____________________________ Gladstone Lighter, MD rk:np D: 04/22/2013 22:18:24 ET T: 04/22/2013 23:13:54 ET JOB#: [id number]  cc: Gladstone Lighter, MD, <Dictator> Gladstone Lighter MD ELECTRONICALLY SIGNED 05/15/2013 13:59

## 2014-05-30 NOTE — Discharge Summary (Signed)
PATIENT NAME:  Richard Graves, Richard Graves MR#:  [id number] DATE OF BIRTH:  Sep 02, 1950  DATE OF ADMISSION:  04/22/2013 DATE OF DISCHARGE:  05/01/2013   PRIMARY CARE PHYSICIAN: None.  FINAL DIAGNOSES:  1. Left hip fracture.  2. Delirium tremens, which resolved.  3. Postoperative anemia.  4. Hyponatremia.  5. Urinary tract infection, which was treated.   MEDICATIONS ON DISCHARGE: Include:  1. Oxycodone 5 mg every 4 hours as needed for pain.  2. MiraLax 17 grams orally once a day as needed for constipation.  3. Magnesium oxide 400 mg daily.  4. Nicotine patch 21 mg to chest wall daily.  5. Ferrous sulfate 325 mg twice a day with meals. 6. Enoxaparin 40 mg subcutaneous every 24 hours for 14 days, then start aspirin 81 mg p.o. daily.   FOLLOWUP: [name], orthopedics, as outpatient, 1 to 2 days with doctor at rehab.   HOSPITAL COURSE:  1. The patient was admitted 04/22/2013 with a fall and hip fracture. Unfortunately, the patient did have cocaine in his urine toxicology, and we had to wait for cocaine to get out of his system before he went to the operating room, then also went through alcohol withdrawal. The patient was medically stable for the operation on 04/28/2013, underwent that operation by Dr. Rosita KeaMenz. He has been seen by physical therapy. Today is postoperative day 3. He should be stable for rehab and follow up as outpatient.  2. Delirium tremens secondary to alcohol withdrawal. The patient has gone through entire alcohol withdrawal while here. No need for any medications as outpatient.  3. Postoperative anemia. The patient's hemoglobin is down to 8.5. Iron supplementation started. Recheck a hemoglobin in 2 weeks.  4. Hyponatremia, likely secondary to alcohol abuse. Continue to follow that up. Can check a BMP in 2 weeks.  5. Urinary tract infection. This was treated during the hospital course.  6. Cocaine abuse.  7. Tobacco abuse. Smoking cessation counseling was done during the hospital course.  Nicotine patch applied.   LABORATORY AND RADIOLOGICAL DATA DURING THE HOSPITAL COURSE: Included an EKG that showed sinus bradycardia, RSR prime. Urine toxicology positive for cocaine. Urinalysis: 1+ bacteria. Ethanol level 435. Troponin negative. Glucose 96, BUN 8, creatinine 0.90, sodium 137, potassium 3.8, chloride 106, CO2 25, calcium 8.2. Liver function tests: AST slightly elevated at 68. White blood cell count 6.0, H and H 12.8 and 41.1, platelet count of 236. Chest x-ray: No active disease. CT scan of the head: Negative. Tibia and fibula on the left: Negative. Left hip: Showed severely comminuted left intertrochanteric hip fracture. Urine toxicology repeat on the 18th positive for cocaine. Magnesium 1.7. Hemoglobin upon discharge 8.5, creatinine 0.73, sodium 130.   TIME SPENT ON DISCHARGE: 35 minutes.   ____________________________ Herschell Dimesichard J. Renae GlossWieting, MD rjw:lb D: 05/01/2013 09:04:43 ET T: 05/01/2013 09:13:30 ET JOB#: [id number]  cc: Herschell Dimesichard J. Renae GlossWieting, MD, <Dictator> Primary Care Physician Salley ScarletICHARD J Dewie Ahart MD ELECTRONICALLY SIGNED 05/02/2013 16:32

## 2014-05-30 NOTE — Consult Note (Signed)
Brief Consult Note: Diagnosis: Right pertrochanteric femur fracture.   Patient was seen by consultant.   Orders entered.   Comments: Recommend ORIF of the right pertrochanteric femur fracture.  The risks and benefits of surgical intervention were discussed in detail with the patient. The patient expressed understanding of the risks and benefits and agreed with plans for surgery.   Surgical site signed as per "right site surgery" protocol.  Electronic Signatures: Donato HeinzHooten, James P (MD)  (Signed 04-Jun-15 01:59)  Authored: Brief Consult Note   Last Updated: 04-Jun-15 01:59 by Donato HeinzHooten, James P (MD)

## 2014-05-30 NOTE — H&P (Signed)
PATIENT NAME:  Richard Graves, GOLDMAN MR#:  [id number] DATE OF BIRTH:  August 07, 1950  DATE OF ADMISSION:  07/09/2013  REFERRING PHYSICIAN: Minna Antis.   PRIMARY CARE PHYSICIAN: None.   CHIEF COMPLAINT: Leg pain.   HISTORY OF PRESENT ILLNESS: A 64 year old African American gentleman with history of alcohol abuse, tobacco, as well as cocaine abuse, presenting with leg pain, recently diagnosed with a left hip fracture. Now uses a cane for ambulation. He suffered a mechanical fall today. Had immediate pain over the right thigh/hip area, 10 out of 10 in intensity, worse with movement. No relieving factors. Stated the pain as sharp. Found to have a right intertrochanteric femur fracture.   REVIEW OF SYSTEMS:  CONSTITUTIONAL: Denies fever, fatigue, weakness.  EYES: Denies blurred vision, double vision, eye pain.  EARS, NOSE, THROAT: Denies tinnitus, ear pain or hearing loss.  RESPIRATORY: Denies cough, wheeze, shortness of breath.  CARDIOVASCULAR: Denies chest pain, palpitations or edema.  GASTROINTESTINAL: Denies nausea, vomiting, diarrhea or abdominal pain.  GENITOURINARY: Denies dysuria or hematuria.  ENDOCRINE: Denies nocturia or thyroid problems.  HEMATOLOGIC AND LYMPHATIC: Denies easy bruising or bleeding.  SKIN: Denies rashes or lesions.  MUSCULOSKELETAL: Positive for pain in right leg as described above. Otherwise, denies any pain in neck, back, shoulders, knees or hips.  NEUROLOGIC: Denies paralysis, paresthesias.  PSYCHIATRIC: Denies anxiety or depressive symptoms.   Otherwise, full review of systems performed by me is negative.   PAST MEDICAL HISTORY: Alcohol and tobacco, as well as cocaine, abuse.   SOCIAL HISTORY: Positive for alcohol usage. He drinks about 4 beers daily. Last drink greater than 24 hours ago. Tobacco use; smokes about 3 cigarettes daily. Cocaine abuse which he denies at this time.   FAMILY HISTORY: Denies any known cardiovascular or pulmonary disorders.    ALLERGIES: No known drug allergies.   HOME MEDICATIONS: States none.   PHYSICAL EXAMINATION:  VITAL SIGNS: Temperature 98.4, heart rate 104, respirations 20, blood pressure 140/86, saturating 95% on room air. Weight 63.5 kg, BMI 27.3.  GENERAL: Well-nourished, well-developed, African American gentleman, currently in minimal distress secondary to pain.  HEAD: Normocephalic, atraumatic.  EYES: Pupils equal, round and reactive to light. Extraocular muscles intact. No scleral icterus.  MOUTH: Moist mucosal membranes. Dentition intact. No abscess noted.  EARS, NOSE, THROAT: Clear without exudates. No external lesions.  NECK: Supple. No thyromegaly. No nodules. No JVD.  PULMONARY: Clear to auscultation bilaterally without wheezes, rales or rhonchi. No use of accessory muscles. Good respiratory effort.  CHEST: Nontender to palpation.  CARDIOVASCULAR: S1, S2, regular rate and rhythm. No murmurs, rubs or gallops. No edema. Pedal pulses 2+ bilaterally.  GASTROINTESTINAL: Soft, nontender, nondistended. No masses. Positive bowel sounds. No hepatosplenomegaly.  MUSCULOSKELETAL: Swelling of the lateral portion of the right thigh which is tender to palpation. No clubbing or edema. Range of motion limited in the right lower extremity secondary to pain; however, distal range of motion intact.  NEUROLOGIC: Cranial nerves II through XII intact. No gross focal neurological deficits. Sensation intact. Reflexes intact.   SKIN: No ulceration, lesions, rash, cyanosis. Skin warm, dry. Turgor intact.  PSYCHIATRIC: Mood and affect within normal limits. The patient is awake, alert and oriented x 3. Insight and judgment intact.   LABORATORY DATA: EKG performed, normal sinus rhythm minimal voltage criteria, for left ventricular hypertrophy, no ST or T wave abnormalities. Radiographic imaging: Severely comminuted intertrochanteric fracture of the proximal right femur. Chest x-ray performed, reveals emphysema without any  acute cardiopulmonary process. Remainder of laboratory data:  Sodium 135, chloride 100, bicarb 25, BUN 8, creatinine 0.7, glucose 127. LFTs: Albumin 3.1, bilirubin 1.1, otherwise within normal limits. WBC 10, hemoglobin 10.5, platelets of 416. Urinalysis negative for evidence of infection. Urine drug screen is pending at this time.   ASSESSMENT AND PLAN: A 64 year old gentleman with history of alcohol, tobacco and cocaine abuse, presenting with leg pain, found to have a right intertrochanteric fracture.  1. Preoperative evaluation of right intertrochanteric fracture. Should be considered moderate risk for moderate risk surgery. No further testing required prior to surgery. His METS should be considered less than 4 due to ambulatory dysfunction. He, however, has no active arrhythmias, congestive heart failure symptoms or valvular dysfunction. States he takes no medications at home. Will deferthromboembolism prophlyaxis and pain medications to orthopedic surgery. Will add bowel regimen.  2. Alcohol abuse: Last drink greater than 24 hours. Initiate CIWA protocol.   3. Hyponatremia: Intravenous fluid hydration with normal saline. Follow sodium levels.  4. Venous thromboembolism prophylaxis with sequential compression devices.   The patient is FULL CODE.   TIME SPENT: 45 minutes.   ____________________________ Cletis Athensavid K. Evann Erazo, MD dkh:gb D: 07/09/2013 21:51:37 ET T: 07/09/2013 23:14:38 ET JOB#: [id number]  cc: Cletis Athensavid K. Rasha Ibe, MD, <Dictator> Solace Wendorff Synetta ShadowK Catherina Pates MD ELECTRONICALLY SIGNED 07/10/2013 1:17
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
## 2014-05-30 NOTE — Discharge Summary (Signed)
PATIENT NAME:  Richard Graves, Richard Graves MR#:  [id number] DATE OF BIRTH:  23-Jun-1950  DATE OF ADMISSION:  07/09/2013 DATE OF DISCHARGE:  07/14/2013  DISCHARGE DIAGNOSES: 1.  Right femur fracture status post open reduction internal fixation, postoperative day 3, doing well.  2.  Alcohol and tobacco abuse, counseled.  3.  Hyponatremia, improved with hydration.  4.  Low vitamin D, on supplement.  5.  Acute on chronic blood loss anemia, likely intraoperative, required transfusion, now hemodynamically stable. 6.  Hypokalemia, repleted and resolved.  7.  Hypertension. Started on Norvasc.   SECONDARY DIAGNOSIS: Alcohol and tobacco abuse.   CONSULTATIONS: Orthopedic, Dr. Francesco SorJames Hooten.  PROCEDURES AND RADIOLOGY: Right femur fracture, status post ORIF on 5th of June by Dr. Ernest PineHooten.   Right femur x-ray on 3rd of June showed severely comminuted intertrochanteric fracture of the proximal right femur.   Pelvic x-ray on 3rd of June showed comminuted right intertrochanteric fracture. Remotely healed proximal left femur fracture with fixation hardware in place.   Chest x-ray on the 3rd of June showed emphysema without acute disease.   Right femur x-ray on the 5th of June showed status post ORIF for comminuted intertrochanteric right hip fracture with restoration of near anatomic alignment and no acute complicating fracture.  MAJOR LABORATORY PANEL: UA on admission was negative.   Serum vitamin D level was low with a value of 21.2.   HISTORY AND SHORT HOSPITAL COURSE: The patient is a 64 year old male with above-mentioned medical problems who was admitted for right leg pain and was found to have right intertrochanteric fracture. Please see Dr. Deatra Inaavid Hower's dictated history and physical for further details. Orthopedic consultation was obtained with Dr. Ernest PineHooten who recommended surgery which was performed on the 5th of June without any immediate complication. The patient was slowly getting better with ongoing therapy.  He did require 1 unit of blood transfusion for anemia. He was improving with IV fluids for his low sodium. His potassium was also replaced. He was found to have somewhat elevated blood pressure for which he was started on Norvasc and blood pressure was improving.   He is doing much better by the 8th of June, was recommended rehab placement where he is being discharged.  PERTINENT DISCHARGE PHYSICAL EXAMINATION: VITAL SIGNS: Temperature 98, heart rate 79 per minute, respirations 16 per minute, blood pressure 154/88 mmHg, and he is saturating 98% on room air.  CARDIOVASCULAR: S1 and S2 normal. No murmurs, rubs or gallops.  LUNGS: Clear to auscultation bilaterally. No wheezing, rales, rhonchi, or crepitation.  ABDOMEN: Soft, benign.  NEUROLOGIC: Nonfocal examination. All other physical examination remained at baseline.   DISCHARGE MEDICATIONS: 1.  Cholecalciferol 2000 units once daily.  2.  Docusate/senna 1 tablet twice a day.  3.  Lovenox 30 mg subcutaneous every 12 hours.  4.  Norvasc 10 mg p.o. daily.  5.  Magnesium oxide 30 mL twice a day as needed.  6.  Oxycodone 5 mg 1 to 2 tablets every 4 hours as needed. 7.  Tramadol 50 mg 1 to 2 tablets every 4 hours as needed.  8.  Clonidine 0.1 mg p.o. every 3 hours as needed for hypertension or systolic pressure more than 160.  9.  Nitroglycerin 1 inch topically every 6 hours as needed.  10.  Magnesium oxide 400 mg p.o. daily.  11.  Protonix 40 mg p.o. b.i.d.  12.  Multivitamin once daily.  13.  Thiamine 50 mg p.o. daily.   DISCHARGE DIET: Regular.   DISCHARGE ACTIVITY:  Per orthopedics and per physical therapy recommendation.   DISCHARGE INSTRUCTIONS AND FOLLOW-UP: The patient was instructed to follow up with Dr. Milinda Antis in 1 to 2 weeks. He will need follow-up with his physician at rehab in 1 to 2 days.   TOTAL TIME DISCHARGING THIS PATIENT: 55 minutes.  ____________________________ Ellamae Sia. Sherryll Burger, MD vss:sb D: 07/14/2013 16:13:57  ET T: 07/14/2013 16:49:32 ET JOB#: [id number]  cc: Nikea Settle S. Sherryll Burger, MD, <Dictator> Illene Labrador. Angie Fava., MD [facility] - Physician Ellamae Sia [facility] MD ELECTRONICALLY SIGNED 07/14/2013 21:13

## 2014-05-30 NOTE — Op Note (Signed)
PATIENT NAME:  Richard Graves, Richard Graves MR#:  [id number] DATE OF BIRTH:  Nov 13, 1950  DATE OF PROCEDURE:  07/11/2013  PREOPERATIVE DIAGNOSIS: Urethral stricture.   POSTOPERATIVE DIAGNOSIS:  Urethral stricture, with bulbous urethra.  SURGEON: Falan Hensler D. Edwyna ShellHart, DO   ANESTHESIA: General.   PROCEDURE:  Flexible cystoscopy with urethral dilatation and placement of a Foley.   DESCRIPTION OF PROCEDURE: The patient was brought to have a left hip pinning. They were unable put a Foley in, I am unable to put one in, either, so I took a flexible cystoscope. There is an opening into the bladder, but it is very, very narrow, and it is at the bulbous urethra.  I placed a wire through this. This is an 0.035 Glide wire. It goes easily through the stricture into the bladder, and dilates up to 14 [place]. I cannot really dilate up to 5416 [place] with the dilators over the wire. I am only able to get a 12 [place] pediatric Foley into the bladder. I get a nice bladder drip from this. I placed it over the wire. The patient tolerated this well, and goes on to surgery in satisfactory condition.     ____________________________ Caralyn Guileichard D. Edwyna ShellHart, DO rdh:mr D: 07/11/2013 15:19:29 ET T: 07/11/2013 22:13:50 ET JOB#: [id number]  cc: Caralyn Guileichard D. Edwyna ShellHart, DO, <Dictator> Bluford Sedler D Leenah Seidner DO ELECTRONICALLY SIGNED 07/18/2013 15:42

## 2014-05-31 NOTE — H&P (Signed)
PATIENT NAME:  Richard Graves, Richard Graves Richard Graves DATE OF BIRTH:  Feb 04, 1951  DATE OF ADMISSION:  02/09/2011  REFERRING PHYSICIAN: Dr. Maurilio Lovely  ADMITTING PHYSICIAN: Dr. Caryn Section   REASON FOR ADMISSION: Alcohol detox.   IDENTIFYING INFORMATION: Richard Graves is a 64 year old divorced homeless African American male currently living on the streets for the past two years. He has not had any prior inpatient or outpatient psychiatric treatment but has a history of alcohol dependence.   HISTORY OF PRESENT ILLNESS: Richard Graves is a 64 year old divorced homeless African American male who was brought to the Emergency Room by police after he was found passed out in parking lot on [address]. The patient had a laceration to his right forehead. He also had reported "seeing demons". At the time of admission the patient had an ethanol level of 371 when he first came to the Emergency Room. Toxicology screen was positive for cocaine. The patient was seen by this Clinical research associate after he was sober. The patient reported that he had been living in the streets over the past two years but at times would stay at his niece's home. 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 Prior to that he had various different jobs, no stable employment. He currently drinks 1 to 2 40-ounce beers daily but yesterday says he drink 1-1/2 pints of white liquor. He says he has been drinking "all his life" since a very early age in his teens. The patient does also admit to using cocaine once every few months. He says that he tried marijuana in the past but does not like it. He denies any heroin or prescription narcotic abuse. He denies any benzodiazepine abuse. When asked about mood symptoms or history of any mood symptoms the patient denied any severe depressive symptoms, suicidal thoughts or psychotic symptoms. In the Emergency Room, however, he had reported "seeing  demons". He now denies that he has any problems with any visual hallucinations. He denies any history of any aggression but does appear to have an extensive legal history including multiple charges of breaking and entering. Patient says he has never gone through any detox but does believe that it is time for him to get help and stop drinking alcohol. He is willing to seek treatment at a residential substance abuse treatment facility.   PAST PSYCHIATRIC HISTORY: Patient denies any prior inpatient psychiatric hospitalizations or suicide attempts. He denies ever being on any psychotropic medications in the past. He denies ever going to AA or any residential substance abuse treatment.   SUBSTANCE ABUSE HISTORY: As stated in the history of present illness, he denies any history of any prior alcohol withdrawal seizures and says he has not stopped drinking long enough to experience any shakes or tremors. He does smoke 1/4 pack of cigarettes per day since his teens.   PAST MEDICAL HISTORY: He denies any major medical conditions. He denies any history of surgeries. He denies any history of any TBI or seizures.   OUTPATIENT MEDICATIONS: None.   ALLERGIES: No known drug allergies.   SOCIAL HISTORY: Patient reports being born and raised in [place] area by both his biological parents. He says they are both deceased. He has three brothers, one incarcerated, one who lives with his niece and he says all of his sisters have passed. He denies any history of any physical or sexual abuse. He has a  sixth grade education and never obtained his GED. He has a long history of unstable employment but worked last four years ago at [employer] for a one year period. He has been married for a two year period in the past but divorced for the past six years. He just said that he and his wife "went their separate ways". He has one daughter age 63 who lives in [place] but does not appear to have a close relationship with her.  As stated in the history of present illness, he is currently homeless.   LEGAL HISTORY: Patient reports having multiple charges in the past of breaking and entering and spent 30 days in jail in the past. He denies any current pending charges.   MENTAL STATUS EXAM: Richard Graves is 64 year old disheveled African American male with a gray beard. He had a laceration to the right forehead. Dentition was extremely poor and he only had two bottom teeth. He was fully alert and oriented to time, place, and situation. Affect was calm and pleasant. No visible tremors. Speech was regular rate and rhythm, fluent and coherent. Mood was described as being "okay" and again affect was pleasant. He denied any current suicidal thoughts or homicidal thoughts. He denied any current auditory or visual hallucinations. No paranoid thoughts or delusions. Thought processes were linear, logical, and goal-directed. Attention and concentration were fair. The patient did have difficulty with recall. Recall was three out of three initially and one out of three after five minutes. He could not name presidents prior to Obama. He had difficulty with serial sevens and even simple calculations. He was unable to spell world backwards or even forwards. When asked about proverbs, the patient said, "I don't know".   SUICIDE RISK ASSESSMENT: At this time Richard Graves is denying any suicidal thoughts but does present as a danger to himself secondary to severe alcohol use. He denies having any access to guns. He does have a number of psychosocial stressors including financial problems, homelessness, lack of primary support.   REVIEW OF SYSTEMS: CONSTITUTIONAL: He denies any weakness, fatigue or weight changes. He denies any fever, chills, or night sweats. HEENT: He denies headaches, dizziness, change in vision, difficulty hearing. EYES: No diplopia or blurred vision. ENT: No hearing loss. RESPIRATORY: No shortness of breath or cough. CARDIOVASCULAR: No chest  pain, orthopnea. GASTROINTESTINAL: He denies any nausea, vomiting, or abdominal pain. He denies any change in bowel movements. GENITOURINARY: He denies incontinence or problems with frequency of urine. ENDOCRINE: He denies any heat or cold intolerance. LYMPHATIC: He denies any anemia or easy bruising. MUSCULOSKELETAL: He denies any muscle or joint pain. NEUROLOGIC: He denies any tingling or weakness. PSYCHIATRIC: Please see history of present illness.   PHYSICAL EXAMINATION:  VITAL SIGNS: Blood pressure 130/78, heart rate 68, respirations 20, temperature 96.6.   HEENT: Patient did have a two inch laceration above his right eyebrow, no active bleeding but skin was clearly broken. No signs of infection. Pupils are equal, round and reactive to light and accommodation. Extraocular movements intact. Oral mucosa moist. Dentition poor. Patient only had two bottom teeth.   NECK: Supple. No cervical lymphadenopathy or thyromegaly present.   LUNGS: Clear to auscultation bilaterally. No crackles, rales, or rhonchi.   CARDIAC: S1, S2, present. Regular rate and rhythm. No murmurs, rubs, or gallops.   ABDOMEN: Soft. Normoactive bowel sounds present in all four quadrants. No tenderness noted. No masses noted.   EXTREMITIES: Patient had very long toenails. +1 pedal pulses  bilaterally. No rashes, cyanosis, clubbing, or edema.   NEUROLOGIC: Cranial nerves II through XII are grossly intact. Gait was slow but steady. Negative Romberg. No visible shakes or tremors. Patient was hyperreflexic bilaterally in lower extremities.   LABORATORY, DIAGNOSTIC AND RADIOLOGICAL DATA: Toxicology screen positive for cocaine. Ethanol level 371. BMP within normal limits. Glucose 110, alkaline phosphatase 48, AST 36, ALT 35, TSH 0.71. Urinalysis was nitrate and leukocyte esterase negative. Acetaminophen level less than 2. Salicylates 3.4. EKG showed a ventricular rate of 73 with a QTc interval of 458.   DIAGNOSES:  AXIS I:   1. Alcohol dependence. 2. Cocaine abuse.  3. Nicotine dependence.   AXIS II: Deferred.   AXIS III: No major medical conditions.   AXIS IV: Severe. Homelessness, financial problems, lack of primary support, history of legal problems.   AXIS V: GAF at present equals 35.   ASSESSMENT AND TREATMENT RECOMMENDATIONS: Mr. Schellenberg is a 64 year old homeless African American male living on the streets for the past two years who presented to the Emergency Room after having had a fall in the context of alcohol intoxication and cocaine abuse. The patient is wanting help with alcohol detox and is willing to pursue residential substance abuse treatment. Will admit to inpatient psychiatry for medication management, safety, stabilization, and alcohol detox. Will need to gain collateral information from the patient's niece. The patient is estranged from his family and was unable to even provide a number for his niece.  1. Alcohol dependence and cocaine abuse. Patient was started on Ativan per [facility] as well as given multivitamin, thiamine and folic acid. He was advised to abstain from alcohol and all illicit drugs as it can have negative health consequences. The patient is willing to go to [facility] and will make a referral to [facility].  2. Rule out mood disorder. The patient is currently denying any suicidal thoughts or depressive symptoms.  3. Will check B12 and folate level.  4. Will try to gain collateral information from the patient's niece if contact information can be obtained.  5. DISPOSITION: Will make a referral to [facility] for residential substance abuse treatment.  ____________________________ Doralee Albino. Maryruth Bun, MD akk:cms D: 02/09/2011 09:51:12 ET T: 02/09/2011 10:12:48 ET JOB#: [id number]  cc: Imara Standiford K. Maryruth Bun, MD, <Dictator> Darliss Ridgel MD ELECTRONICALLY SIGNED 02/10/2011 6:44

## 2014-06-01 LAB — SURGICAL PATHOLOGY

## 2014-08-22 ENCOUNTER — Emergency Department: Payer: Medicaid Other

## 2014-08-22 ENCOUNTER — Encounter: Payer: Self-pay | Admitting: Emergency Medicine

## 2014-08-22 ENCOUNTER — Emergency Department
Admission: EM | Admit: 2014-08-22 | Discharge: 2014-08-23 | Disposition: A | Discharge status: Home / Self Care | Payer: Medicaid Other | Attending: Emergency Medicine | Admitting: Emergency Medicine

## 2014-08-22 DIAGNOSIS — F10120 Alcohol abuse with intoxication, uncomplicated: Secondary | ICD-10-CM | POA: Diagnosis present

## 2014-08-22 DIAGNOSIS — F10920 Alcohol use, unspecified with intoxication, uncomplicated: Secondary | ICD-10-CM

## 2014-08-22 LAB — COMPREHENSIVE METABOLIC PANEL
ALBUMIN: 4.4 g/dL (ref 3.5–5.0)
ALT: 26 U/L (ref 17–63)
AST: 49 U/L — ABNORMAL HIGH (ref 15–41)
Alkaline Phosphatase: 73 U/L (ref 38–126)
Anion gap: 11 (ref 5–15)
BILIRUBIN TOTAL: 0.3 mg/dL (ref 0.3–1.2)
BUN: 6 mg/dL (ref 6–20)
CALCIUM: 8.7 mg/dL — AB (ref 8.9–10.3)
CO2: 23 mmol/L (ref 22–32)
Chloride: 105 mmol/L (ref 101–111)
Creatinine, Ser: 0.64 mg/dL (ref 0.61–1.24)
GFR calc non Af Amer: >60 mL/min (ref >60)
GLUCOSE: 99 mg/dL (ref 65–99)
POTASSIUM: 3.7 mmol/L (ref 3.5–5.1)
Sodium: 139 mmol/L (ref 135–145)
Total Protein: 8.9 g/dL — ABNORMAL HIGH (ref 6.5–8.1)

## 2014-08-22 LAB — CBC
HCT: 43.6 % (ref 40.0–52.0)
Hemoglobin: 14.6 g/dL (ref 13.0–18.0)
MCH: 33.9 pg (ref 26.0–34.0)
MCHC: 33.3 g/dL (ref 32.0–36.0)
MCV: 101.7 fL — AB (ref 80.0–100.0)
PLATELETS: 263 10*3/uL (ref 150–440)
RBC: 4.29 MIL/uL — ABNORMAL LOW (ref 4.40–5.90)
RDW: 14.2 % (ref 11.5–14.5)
WBC: 4.7 10*3/uL (ref 3.8–10.6)

## 2014-08-22 LAB — ETHANOL: ALCOHOL ETHYL (B): 430 mg/dL — AB (ref <5)

## 2014-08-22 NOTE — ED Notes (Signed)
BEHAVIORAL HEALTH ROUNDING Patient sleeping: Yes.   Patient alert and oriented: not applicable Behavior appropriate: Yes.  ; If no, describe:  Nutrition and fluids offered: No Toileting and hygiene offered: No Sitter present: not applicable Law enforcement present: Yes  

## 2014-08-22 NOTE — ED Notes (Signed)

## 2014-08-22 NOTE — ED Notes (Signed)
RN attempted to draw labs.  Pt refused.  Pt stated "I's drunk and that's all there is to it.  I ain't givin' no blood to nobody.  No sir.  No siree.  I ain't givin' no blood."  MD informed.

## 2014-08-22 NOTE — ED Notes (Signed)
Triage completed as possible without participation from pt.  Pt remains intoxicated.

## 2014-08-22 NOTE — ED Notes (Signed)
BEHAVIORAL HEALTH ROUNDING Patient sleeping: Yes.   Patient alert and oriented: not applicable Behavior appropriate: Yes.  ; If no, describe:  Nutrition and fluids offered: No Toileting and hygiene offered: Yes  Sitter present: not applicable Law enforcement present: Yes  

## 2014-08-22 NOTE — ED Notes (Signed)
BEHAVIORAL HEALTH ROUNDING Patient sleeping: No. Patient alert and oriented: not applicable Behavior appropriate: Yes.  ; If no, describe:  Nutrition and fluids offered: Yes  Toileting and hygiene offered: Yes  Sitter present: not applicable Law enforcement present: Yes  

## 2014-08-22 NOTE — ED Notes (Signed)
Patient transported to CT 

## 2014-08-22 NOTE — ED Notes (Signed)
Pt OOB holding his penis came to the door as he urinated and saying he had to go to the bathroom.  HT escorted pt to bathroom and back to bed.  Urine collection not possible at this time.
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
## 2014-08-22 NOTE — ED Notes (Signed)
Pt sleeping.  Breathing even and unlabored.  Strong smell of alcohol.

## 2014-08-22 NOTE — ED Notes (Signed)
Lab reported ETOH of 424.  MD  Informed.

## 2014-08-22 NOTE — ED Notes (Signed)
Pt stripped and cleaned of urine and feces.  Clothes double bagged and sent to [facility] lock-up.

## 2014-08-23 NOTE — ED Provider Notes (Signed)
-----------------------------------------   7:16 AM on 08/23/2014 -----------------------------------------  There were no events overnight. Patient is now awake, alert, oriented. Tolerated PO and is ambulatory with steady gait. Strict return precautions given. Patient verbalizes understanding and agrees with plan of care. He is awaiting a ride home.  Irean HongJade J Prince Couey, MD 08/23/14 [phone]

## 2014-08-23 NOTE — ED Notes (Signed)
Pt. Woke up wanted something to drink.  Gave pt. 8oz of OJ.  Told pt. If he wanted more would give.
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
## 2014-08-23 NOTE — Discharge Instructions (Signed)
Drink responsibly and only in moderation. Return to the ER for worsening symptoms, persistent vomiting, lethargy or other concerns.  Alcohol Intoxication Alcohol intoxication occurs when the amount of alcohol that a person has consumed impairs his or her ability to mentally and physically function. Alcohol directly impairs the normal chemical activity of the brain. Drinking large amounts of alcohol can lead to changes in mental function and behavior, and it can cause many physical effects that can be harmful.  Alcohol intoxication can range in severity from mild to very severe. Various factors can affect the level of intoxication that occurs, such as the person's age, gender, weight, frequency of alcohol consumption, and the presence of other medical conditions (such as diabetes, seizures, or heart conditions). Dangerous levels of alcohol intoxication may occur when people drink large amounts of alcohol in a short period (binge drinking). Alcohol can also be especially dangerous when combined with certain prescription medicines or "recreational" drugs. SIGNS AND SYMPTOMS Some common signs and symptoms of mild alcohol intoxication include:  Loss of coordination.  Changes in mood and behavior.  Impaired judgment.  Slurred speech. As alcohol intoxication progresses to more severe levels, other signs and symptoms will appear. These may include:  Vomiting.  Confusion and impaired memory.  Slowed breathing.  Seizures.  Loss of consciousness. DIAGNOSIS  Your health care provider will take a medical history and perform a physical exam. You will be asked about the amount and type of alcohol you have consumed. Blood tests will be done to measure the concentration of alcohol in your blood. In many places, your blood alcohol level must be lower than 80 mg/dL (1.61%0.08%) to legally drive. However, many dangerous effects of alcohol can occur at much lower levels.  TREATMENT  People with alcohol  intoxication often do not require treatment. Most of the effects of alcohol intoxication are temporary, and they go away as the alcohol naturally leaves the body. Your health care provider will monitor your condition until you are stable enough to go home. Fluids are sometimes given through an IV access tube to help prevent dehydration.  HOME CARE INSTRUCTIONS  Do not drive after drinking alcohol.  Stay hydrated. Drink enough water and fluids to keep your urine clear or pale yellow. Avoid caffeine.   Only take over-the-counter or prescription medicines as directed by your health care provider.  SEEK MEDICAL CARE IF:   You have persistent vomiting.   You do not feel better after a few days.  You have frequent alcohol intoxication. Your health care provider can help determine if you should see a substance use treatment counselor. SEEK IMMEDIATE MEDICAL CARE IF:   You become shaky or tremble when you try to stop drinking.   You shake uncontrollably (seizure).   You throw up (vomit) blood. This may be bright red or may look like black coffee grounds.   You have blood in your stool. This may be bright red or may appear as a black, tarry, bad smelling stool.   You become lightheaded or faint.  MAKE SURE YOU:   Understand these instructions.  Will watch your condition.  Will get help right away if you are not doing well or get worse. Document Released: 11/02/2004 Document Revised: 09/25/2012 Document Reviewed: 06/28/2012 [facility] Patient Information 2015 [employer] [place]. This information is not intended to replace advice given to you by your health care provider. Make sure you discuss any questions you have with your health care provider.

## 2014-08-23 NOTE — ED Notes (Signed)
Pt alert and oriented this AM ready for discharge and pt states "I am ready to go home". Pt steady on feet. Nurse to find a change of paper scrubs for him and then to discharge pt home.

## 2014-08-23 NOTE — ED Notes (Signed)

## 2014-08-23 NOTE — ED Notes (Signed)
Pt given breakfast tray

## 2014-09-10 IMAGING — CR DG CHEST 1V PORT
1 series · 1 of 1 positions shown · non-contrast
Comparison: DG CHEST 1V PORT dated 09/12/2012; CT CERVICAL SPINE W/O
CM dated 09/12/2012; DG CHEST 2V dated 04/14/2012

CLINICAL DATA: Altered mental status, left leg pain

EXAM:
PORTABLE CHEST - 1 VIEW

[x chest ap]
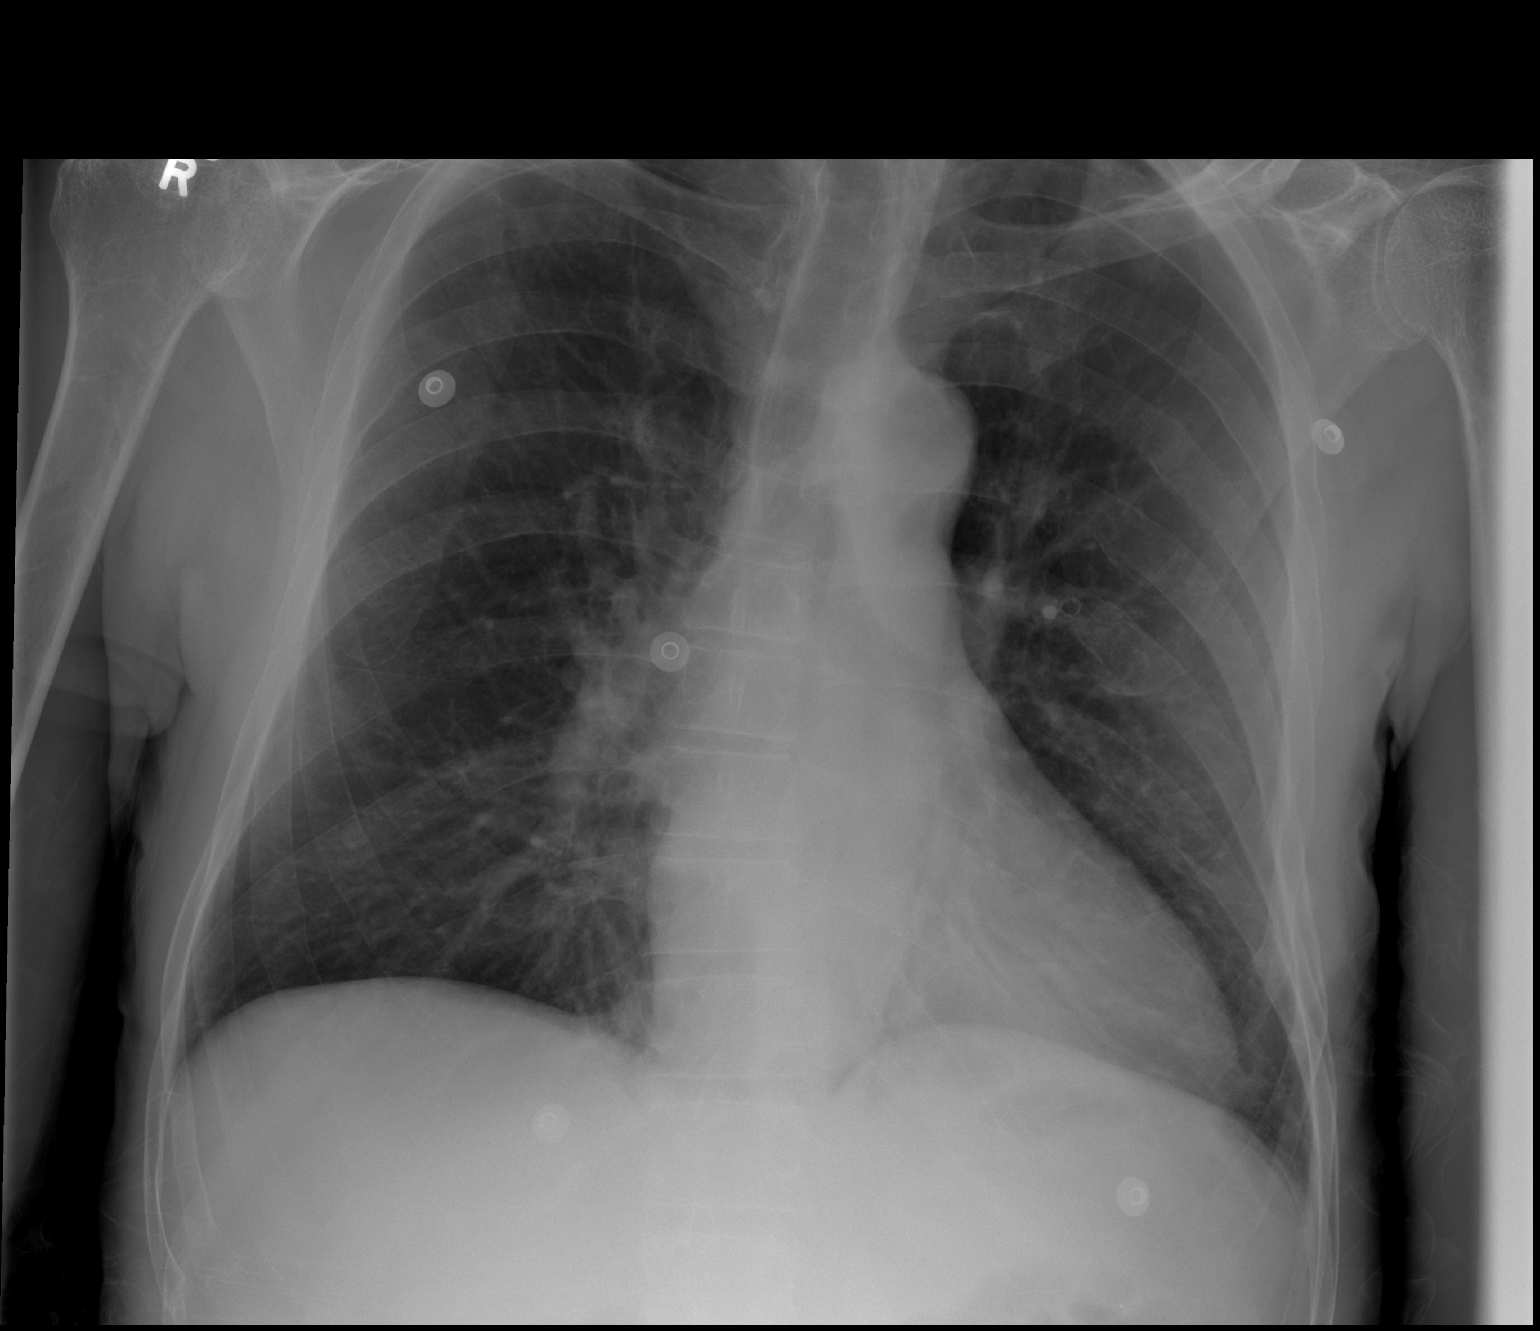

[1 of 1 positions shown; findings below may reference images not displayed]

FINDINGS: The heart size and mediastinal contours are within normal limits.
Both lungs are clear. Stable mild deformities of left fifth through
eighth ribs consistent with prior fractures.
IMPRESSION: No active disease.

## 2014-09-10 IMAGING — CR DG TIBIA/FIBULA 2V*L*
1 series · 2 of 2 positions shown · non-contrast
Comparison: None.

CLINICAL DATA: Left lower leg pain after fall

EXAM:
LEFT TIBIA AND FIBULA - 2 VIEW

[Series 1: ap · 0.17mm/px · 2 of 2 slices shown]
[im 1/2]
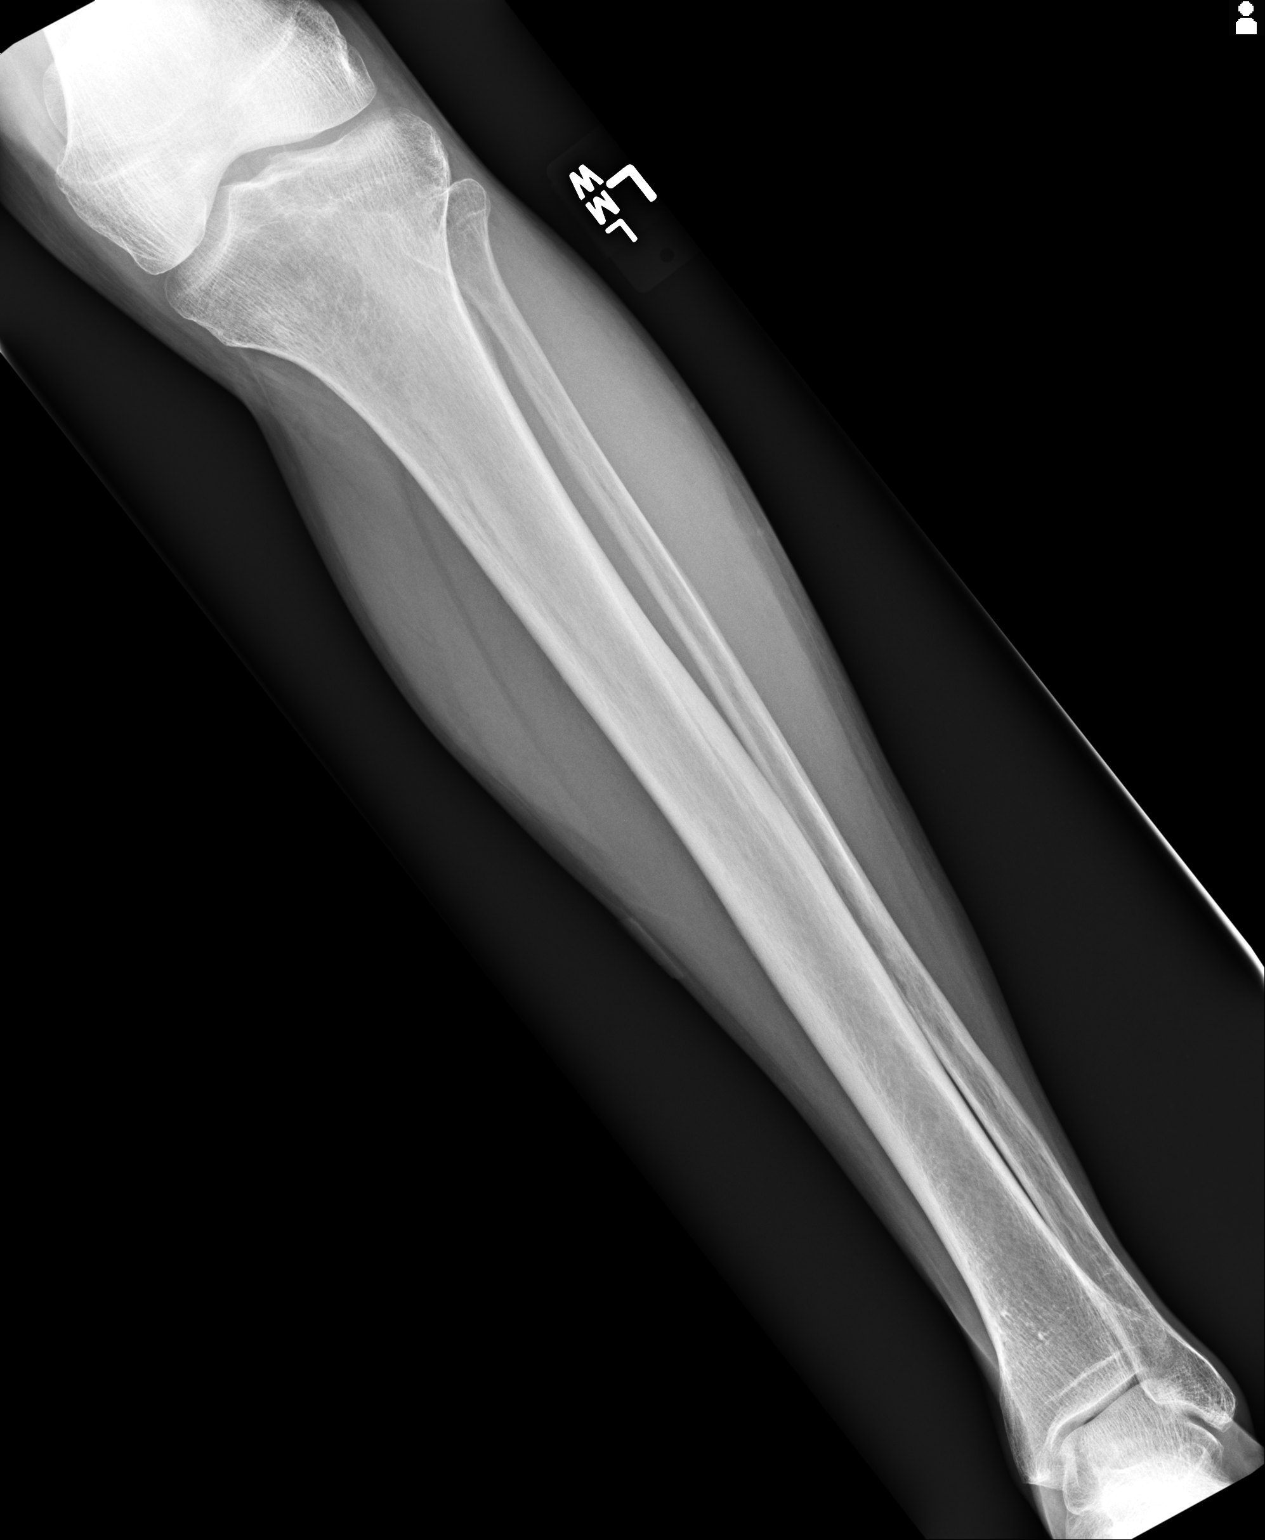
[im 2/2]
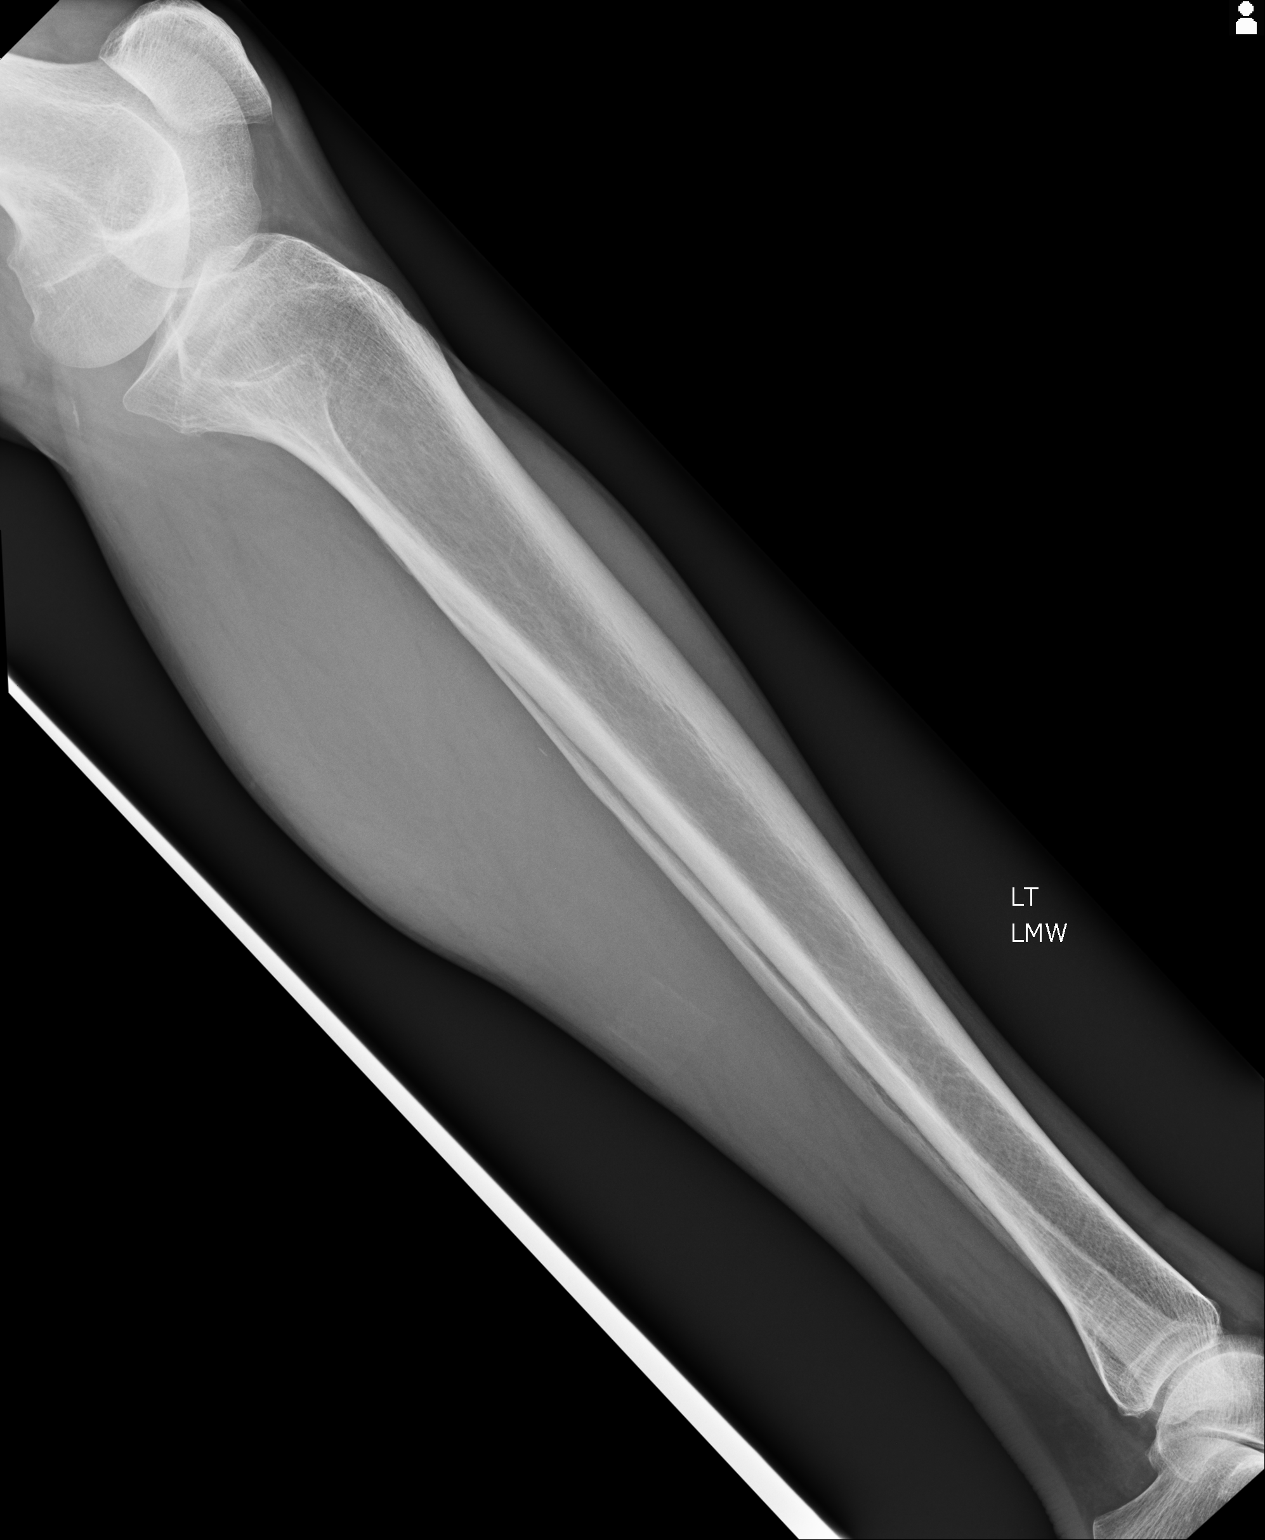

[2 of 2 positions shown; findings below may reference images not displayed]

FINDINGS: There is no evidence of fracture or other focal bone lesions. Soft
tissues are unremarkable.
IMPRESSION: Negative.

## 2016-01-07 ENCOUNTER — Emergency Department: Payer: Medicaid Other

## 2016-01-07 ENCOUNTER — Emergency Department
Admission: EM | Admit: 2016-01-07 | Discharge: 2016-01-07 | Disposition: A | Discharge status: Home / Self Care | Payer: Medicaid Other | Attending: Student in an Organized Health Care Education/Training Program | Admitting: Student in an Organized Health Care Education/Training Program

## 2016-01-07 DIAGNOSIS — Y929 Unspecified place or not applicable: Secondary | ICD-10-CM | POA: Diagnosis not present

## 2016-01-07 DIAGNOSIS — S42021A Displaced fracture of shaft of right clavicle, initial encounter for closed fracture: Secondary | ICD-10-CM | POA: Diagnosis not present

## 2016-01-07 DIAGNOSIS — G9389 Other specified disorders of brain: Secondary | ICD-10-CM | POA: Insufficient documentation

## 2016-01-07 DIAGNOSIS — Y939 Activity, unspecified: Secondary | ICD-10-CM | POA: Insufficient documentation

## 2016-01-07 DIAGNOSIS — Y999 Unspecified external cause status: Secondary | ICD-10-CM | POA: Diagnosis not present

## 2016-01-07 DIAGNOSIS — S4991XA Unspecified injury of right shoulder and upper arm, initial encounter: Secondary | ICD-10-CM | POA: Diagnosis present

## 2016-01-07 DIAGNOSIS — F1010 Alcohol abuse, uncomplicated: Secondary | ICD-10-CM | POA: Diagnosis not present

## 2016-01-07 LAB — CBC
HEMATOCRIT: 35.6 % — AB (ref 40.0–52.0)
Hemoglobin: 12 g/dL — ABNORMAL LOW (ref 13.0–18.0)
MCH: 32.4 pg (ref 26.0–34.0)
MCHC: 33.6 g/dL (ref 32.0–36.0)
MCV: 96.3 fL (ref 80.0–100.0)
PLATELETS: 210 10*3/uL (ref 150–440)
RBC: 3.69 MIL/uL — AB (ref 4.40–5.90)
RDW: 15.3 % — AB (ref 11.5–14.5)
WBC: 6.4 10*3/uL (ref 3.8–10.6)

## 2016-01-07 LAB — BASIC METABOLIC PANEL
Anion gap: 11 (ref 5–15)
BUN: 8 mg/dL (ref 6–20)
CO2: 22 mmol/L (ref 22–32)
Calcium: 8.8 mg/dL — ABNORMAL LOW (ref 8.9–10.3)
Chloride: 99 mmol/L — ABNORMAL LOW (ref 101–111)
Creatinine, Ser: 0.65 mg/dL (ref 0.61–1.24)
GFR calc Af Amer: >60 mL/min (ref >60)
Glucose, Bld: 84 mg/dL (ref 65–99)
Potassium: 4.1 mmol/L (ref 3.5–5.1)
Sodium: 132 mmol/L — ABNORMAL LOW (ref 135–145)

## 2016-01-07 LAB — ETHANOL: Alcohol, Ethyl (B): 155 mg/dL — ABNORMAL HIGH (ref <5)

## 2016-01-07 MED ORDER — HYDROCODONE-ACETAMINOPHEN 5-325 MG PO TABS: 1.0000 | ORAL_TABLET | ORAL | 0 refills | Status: DC | PRN

## 2016-01-07 MED ORDER — OXYCODONE HCL 5 MG PO TABS
5.0000 mg | ORAL_TABLET | Freq: Once | ORAL | Status: AC
  Administered 2016-01-07: 5 mg via ORAL
  Filled 2016-01-07: qty 1

## 2016-01-07 MED ORDER — IBUPROFEN 600 MG PO TABS
600.0000 mg | ORAL_TABLET | Freq: Once | ORAL | Status: AC
Start: 2016-01-07 — End: 2016-01-07
  Administered 2016-01-07: 600 mg via ORAL
  Filled 2016-01-07: qty 1

## 2016-01-07 MED ORDER — NAPROXEN 375 MG PO TABS
375.0000 mg | ORAL_TABLET | Freq: Two times a day (BID) | ORAL | 0 refills | Status: AC
Start: 2016-01-07 — End: 2016-01-17

## 2016-01-07 NOTE — ED Notes (Signed)
Pt able to ambulate without difficulty.

## 2016-01-07 NOTE — ED Notes (Signed)
Pt smells like alcohol. Pt reports he drank 2 beers today and normally drinks between 2-4 beers a day.
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
## 2016-01-07 NOTE — ED Notes (Signed)
Sling applied .

## 2016-01-07 NOTE — ED Notes (Signed)
Pt given food and drink.
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
## 2016-09-21 ENCOUNTER — Emergency Department
Admission: EM | Admit: 2016-09-21 | Discharge: 2016-09-22 | Disposition: A | Discharge status: Home / Self Care | Payer: Medicare Other | Attending: Emergency Medicine | Admitting: Emergency Medicine

## 2016-09-21 ENCOUNTER — Encounter: Payer: Self-pay | Admitting: Emergency Medicine

## 2016-09-21 ENCOUNTER — Emergency Department: Payer: Medicare Other

## 2016-09-21 DIAGNOSIS — D61818 Other pancytopenia: Secondary | ICD-10-CM | POA: Insufficient documentation

## 2016-09-21 DIAGNOSIS — F1010 Alcohol abuse, uncomplicated: Secondary | ICD-10-CM

## 2016-09-21 DIAGNOSIS — R4182 Altered mental status, unspecified: Secondary | ICD-10-CM | POA: Diagnosis present

## 2016-09-21 DIAGNOSIS — F10920 Alcohol use, unspecified with intoxication, uncomplicated: Secondary | ICD-10-CM | POA: Insufficient documentation

## 2016-09-21 HISTORY — DX: Alcohol abuse, uncomplicated: F10.10

## 2016-09-21 LAB — COMPREHENSIVE METABOLIC PANEL
ALBUMIN: 4.1 g/dL (ref 3.5–5.0)
ALK PHOS: 49 U/L (ref 38–126)
ALT: 44 U/L (ref 17–63)
ANION GAP: 12 (ref 5–15)
AST: 111 U/L — ABNORMAL HIGH (ref 15–41)
BILIRUBIN TOTAL: 0.6 mg/dL (ref 0.3–1.2)
BUN: 6 mg/dL (ref 6–20)
CALCIUM: 8.2 mg/dL — AB (ref 8.9–10.3)
CO2: 19 mmol/L — ABNORMAL LOW (ref 22–32)
Chloride: 101 mmol/L (ref 101–111)
Creatinine, Ser: 0.66 mg/dL (ref 0.61–1.24)
GFR calc Af Amer: >60 mL/min (ref >60)
GLUCOSE: 98 mg/dL (ref 65–99)
POTASSIUM: 3.7 mmol/L (ref 3.5–5.1)
Sodium: 132 mmol/L — ABNORMAL LOW (ref 135–145)
Total Protein: 8.5 g/dL — ABNORMAL HIGH (ref 6.5–8.1)

## 2016-09-21 LAB — CBC WITH DIFFERENTIAL/PLATELET
BASOS PCT: 2 %
Basophils Absolute: 0.1 10*3/uL (ref 0–0.1)
Eosinophils Absolute: 0 10*3/uL (ref 0–0.7)
Eosinophils Relative: 1 %
HCT: 27.5 % — ABNORMAL LOW (ref 40.0–52.0)
HEMOGLOBIN: 9 g/dL — AB (ref 13.0–18.0)
Lymphocytes Relative: 45 %
Lymphs Abs: 1.3 10*3/uL (ref 1.0–3.6)
MCH: 26.3 pg (ref 26.0–34.0)
MCHC: 32.7 g/dL (ref 32.0–36.0)
MCV: 80.4 fL (ref 80.0–100.0)
MONO ABS: 0.4 10*3/uL (ref 0.2–1.0)
Monocytes Relative: 12 %
NEUTROS PCT: 40 %
Neutro Abs: 1.1 10*3/uL — ABNORMAL LOW (ref 1.4–6.5)
Platelets: 108 10*3/uL — ABNORMAL LOW (ref 150–440)
RBC: 3.42 MIL/uL — ABNORMAL LOW (ref 4.40–5.90)
RDW: 19.2 % — AB (ref 11.5–14.5)
WBC: 2.9 10*3/uL — ABNORMAL LOW (ref 3.8–10.6)

## 2016-09-21 LAB — ETHANOL: ALCOHOL ETHYL (B): 498 mg/dL — AB (ref <5)

## 2016-09-21 NOTE — ED Notes (Signed)
Patient urinated on the floor. This RN and Dr Mayford KnifeWilliams placed towels on floor to clean up. EVS notified. Patient provided a urinal for next time.
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
## 2016-09-21 NOTE — ED Notes (Signed)
Patients O2 dropped. Placed on 2 L [place]. Patient was asleep snoring lightly.
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
## 2016-09-22 DIAGNOSIS — F10920 Alcohol use, unspecified with intoxication, uncomplicated: Secondary | ICD-10-CM | POA: Diagnosis not present

## 2016-09-22 MED ORDER — SODIUM CHLORIDE 0.9 % IV BOLUS (SEPSIS)
1000.0000 mL | Freq: Once | INTRAVENOUS | Status: AC
  Administered 2016-09-22: 1000 mL via INTRAVENOUS

## 2016-09-22 MED ORDER — LORAZEPAM 2 MG PO TABS: 0.0000 mg | ORAL_TABLET | Freq: Four times a day (QID) | ORAL | Status: DC

## 2016-09-22 MED ORDER — THIAMINE HCL 100 MG/ML IJ SOLN
Freq: Once | INTRAVENOUS | Status: AC
  Administered 2016-09-22: 06:00:00 via INTRAVENOUS
  Filled 2016-09-22: qty 1000

## 2016-09-22 MED ORDER — VITAMIN B-1 100 MG PO TABS: 100.0000 mg | ORAL_TABLET | Freq: Every day | ORAL | Status: DC

## 2016-09-22 NOTE — ED Notes (Signed)
Patient will not keep Richard Graves in nose. Patient is laying on hand that pulse ox is on which is giving false reading.

## 2016-09-22 NOTE — ED Provider Notes (Signed)
The patient is now clinically sober not withdrawing from alcohol. It is safe for him to be discharged.   Merrily Brittle, MD 09/22/16 [phone]

## 2016-09-22 NOTE — ED Provider Notes (Signed)
-----------------------------------------   6:55 AM on 09/22/2016 -----------------------------------------  IV fluids administered for tachycardia. Banana bag infusing. Patient no longer tachycardic. Awoke in the night, urinated over himself as well as the bed. He was cleaned up and went promptly back to sleep. Anticipate discharge home once patient is sober and ambulatory. Care transferred to Dr. Lamont Snowball.   Irean Hong, MD 09/22/16 [phone]

## 2016-09-22 NOTE — Discharge Instructions (Signed)
Please make an appointment to establish primary care within the next week for reevaluation and return to the emergency department for any concerns.  It was a pleasure to take care of you today, and thank you for coming to our emergency department.  If you have any questions or concerns before leaving please ask the nurse to grab me and I'm more than happy to go through your aftercare instructions again.  If you were prescribed any opioid pain medication today such as Norco, Vicodin, Percocet, morphine, hydrocodone, or oxycodone please make sure you do not drive when you are taking this medication as it can alter your ability to drive safely.  If you have any concerns once you are home that you are not improving or are in fact getting worse before you can make it to your follow-up appointment, please do not hesitate to call 911 and come back for further evaluation.  Merrily Brittle, MD  Results for orders placed or performed during the hospital encounter of 09/21/16  CBC with Differential/Platelet  Result Value Ref Range   WBC 2.9 (L) 3.8 - 10.6 K/uL   RBC 3.42 (L) 4.40 - 5.90 MIL/uL   Hemoglobin 9.0 (L) 13.0 - 18.0 g/dL   HCT 11.9 (L) 41.7 - 40.8 %   MCV 80.4 80.0 - 100.0 fL   MCH 26.3 26.0 - 34.0 pg   MCHC 32.7 32.0 - 36.0 g/dL   RDW 14.4 (H) 81.8 - 56.3 %   Platelets 108 (L) 150 - 440 K/uL   Neutrophils Relative % 40 %   Neutro Abs 1.1 (L) 1.4 - 6.5 K/uL   Lymphocytes Relative 45 %   Lymphs Abs 1.3 1.0 - 3.6 K/uL   Monocytes Relative 12 %   Monocytes Absolute 0.4 0.2 - 1.0 K/uL   Eosinophils Relative 1 %   Eosinophils Absolute 0.0 0 - 0.7 K/uL   Basophils Relative 2 %   Basophils Absolute 0.1 0 - 0.1 K/uL  Comprehensive metabolic panel  Result Value Ref Range   Sodium 132 (L) 135 - 145 mmol/L   Potassium 3.7 3.5 - 5.1 mmol/L   Chloride 101 101 - 111 mmol/L   CO2 19 (L) 22 - 32 mmol/L   Glucose, Bld 98 65 - 99 mg/dL   BUN 6 6 - 20 mg/dL   Creatinine, Ser 1.49 0.61 - 1.24 mg/dL    Calcium 8.2 (L) 8.9 - 10.3 mg/dL   Total Protein 8.5 (H) 6.5 - 8.1 g/dL   Albumin 4.1 3.5 - 5.0 g/dL   AST 702 (H) 15 - 41 U/L   ALT 44 17 - 63 U/L   Alkaline Phosphatase 49 38 - 126 U/L   Total Bilirubin 0.6 0.3 - 1.2 mg/dL   GFR calc non Af Amer >60 >60 mL/min   GFR calc Af Amer >60 >60 mL/min   Anion gap 12 5 - 15  Ethanol  Result Value Ref Range   Alcohol, Ethyl (B) 498 (HH) <5 mg/dL   Ct Head Wo Contrast  Result Date: 09/21/2016 CLINICAL DATA:  Patient found lying in ditch. Incontinence. Acute onset of altered level of consciousness. Initial encounter. EXAM: CT HEAD WITHOUT CONTRAST TECHNIQUE: Contiguous axial images were obtained from the base of the skull through the vertex without intravenous contrast. COMPARISON:  CT of the head performed 01/07/2016 FINDINGS: Brain: No evidence of acute infarction, hemorrhage, hydrocephalus, extra-axial collection or mass lesion/mass effect. Prominence of the ventricles and sulci reflects moderate cortical volume loss. Cerebellar atrophy is  noted. Scattered periventricular and subcortical white matter change likely reflects small vessel ischemic microangiopathy. Chronic lacunar infarcts are noted at the basal ganglia bilaterally. The brainstem and fourth ventricle are within normal limits. The cerebral hemispheres demonstrate grossly normal gray-white differentiation. No mass effect or midline shift is seen. Vascular: No hyperdense vessel or unexpected calcification. Skull: There is no evidence of fracture; visualized osseous structures are unremarkable in appearance. Sinuses/Orbits: The visualized portions of the orbits are within normal limits. Mucoperiosteal thickening is noted at the right maxillary sinus. There is mild partial opacification of the left mastoid air cells. The remaining visualized paranasal sinuses and right mastoid air cells are well-aerated. Other: No significant soft tissue abnormalities are seen. IMPRESSION: 1. No acute  intracranial pathology seen on CT. 2. Moderate cortical volume loss and scattered small vessel ischemic microangiopathy. 3. 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 4. Mucoperiosteal thickening at the right maxillary sinus, and mild partial opacification of the left mastoid air cells. Electronically Signed   By: Roanna Raider M.D.   On: 09/21/2016 21:09

## 2016-09-22 NOTE — ED Notes (Signed)
Patient urinated all over himself and the bed. Patient shivering cold due to being wet. This RN changed bed and patient. Warm blanket given. Patient denies any needs at this time.

## 2016-09-28 ENCOUNTER — Emergency Department
Admission: EM | Admit: 2016-09-28 | Discharge: 2016-09-28 | Payer: Medicare Other | Attending: Emergency Medicine | Admitting: Emergency Medicine

## 2016-09-28 ENCOUNTER — Encounter: Payer: Self-pay | Admitting: Emergency Medicine

## 2016-09-28 DIAGNOSIS — F17200 Nicotine dependence, unspecified, uncomplicated: Secondary | ICD-10-CM | POA: Diagnosis not present

## 2016-09-28 DIAGNOSIS — F10929 Alcohol use, unspecified with intoxication, unspecified: Secondary | ICD-10-CM | POA: Diagnosis not present

## 2016-09-28 DIAGNOSIS — IMO0002 Reserved for concepts with insufficient information to code with codable children: Secondary | ICD-10-CM

## 2016-09-28 DIAGNOSIS — Z0289 Encounter for other administrative examinations: Secondary | ICD-10-CM | POA: Diagnosis present

## 2016-09-28 NOTE — ED Triage Notes (Signed)
Patient for medical clearance for jail. Patient states that he has drank 3 or 4 40 oz beers tonight.

## 2016-09-28 NOTE — ED Provider Notes (Signed)
[facility] Emergency Department Provider Note  Time seen: 10:17 PM  I have reviewed the triage vital signs and the nursing notes.   HISTORY  Chief Complaint Medical Clearance    HPI Richard Graves is a 66 y.o. male with a past medical history of alcohol abuse who presents to the emergency department for medical clearance. According to police the patient was found on a front porch that was not his attempting to sleep. Police were called to the scene and arrested the patient. Patient admits alcohol intoxication so they brought him to the emergency departmentfor medical clearance. Currently the patient is awake alert, can tell me his name and where he is. Patient states he drinks alcohol on a daily basis, has never had a seizure. Denies any medical complaints.  Past Medical History:  Diagnosis Date  . ETOH abuse     There are no active problems to display for this patient.   History reviewed. No pertinent surgical history.  Prior to Admission medications   Not on File    No Known Allergies  No family history on file.  Social History Social History  Substance Use Topics  . Smoking status: Current Every Day Smoker  . Smokeless tobacco: Never Used  . Alcohol use Yes    Review of Systems Constitutional: Negative for fever. Cardiovascular: Negative for chest pain. Respiratory: Negative for shortness of breath. Gastrointestinal: Negative for abdominal pain Neurological: Negative for headache All other ROS negative  ____________________________________________   PHYSICAL EXAM:  VITAL SIGNS: ED Triage Vitals  Enc Vitals Group     BP 09/28/16 2200 (!) 141/86     Pulse Rate 09/28/16 2200 78     Resp 09/28/16 2200 18     Temp 09/28/16 2200 97.7 F (36.5 C)     Temp Source 09/28/16 2200 Oral     SpO2 09/28/16 2200 97 %     Weight 09/28/16 2159 120 lb (54.4 kg)     Height 09/28/16 2159 5\' 6"  (1.676 m)     Head Circumference --      Peak Flow  --      Pain Score --      Pain Loc --      Pain Edu? --      Excl. in GC? --     Constitutional: Alert, oriented to person place. Well appearing. Does smell of urine. Eyes: Normal exam ENT   Head: Normocephalic and atraumatic.   Mouth/Throat: Mucous membranes are moist. Cardiovascular: Normal rate, regular rhythm. Respiratory: Normal respiratory effort without tachypnea nor retractions. Breath sounds are clear  Gastrointestinal: Soft and nontender. No distention.  Musculoskeletal: Nontender with normal range of motion in all extremities Neurologic:  Normal speech and language. No gross focal neurologic deficits Skin:  Skin is warm, dry and intact.  Psychiatric: Mood and affect are normal.  ____________________________________________  INITIAL IMPRESSION / ASSESSMENT AND PLAN / ED COURSE  Pertinent labs & imaging results that were available during my care of the patient were reviewed by me and considered in my medical decision making (see chart for details).  Patient presents to the emergency department for medical clearance before being taken to jail. Patient has no medical complaints, overall appears very well, speaks clearly. Patient admits chronic alcoholism. We will ambulate in the emergency department attempt by mouth trial. His lungs a patient can drink and walk ablated the patient is safe for discharge into police custody as he currently appears very well with no medical  complaints. Does not appear to be overly intoxicated.  Patient is able ambulate without any difficulty. Is drinking water, is asking for a couple of coffee. We'll discharge the patient into police custody. ____________________________________________   FINAL CLINICAL IMPRESSION(S) / ED DIAGNOSES  Alcohol intoxication    Minna Antis, MD 09/28/16 2250

## 2016-09-28 NOTE — ED Notes (Signed)
Pt here for medical clearance from doctor to go to jail. Pt reports no pain. Pt found drunk on someone's property.

## 2016-10-06 ENCOUNTER — Observation Stay
Admission: EM | Admit: 2016-10-06 | Discharge: 2016-10-09 | Disposition: A | Discharge status: Home / Self Care | Payer: Medicare Other | Attending: Internal Medicine | Admitting: Internal Medicine

## 2016-10-06 ENCOUNTER — Emergency Department: Payer: Medicare Other

## 2016-10-06 ENCOUNTER — Inpatient Hospital Stay: Payer: Medicare Other

## 2016-10-06 DIAGNOSIS — F1020 Alcohol dependence, uncomplicated: Secondary | ICD-10-CM | POA: Insufficient documentation

## 2016-10-06 DIAGNOSIS — Z590 Homelessness: Secondary | ICD-10-CM | POA: Diagnosis not present

## 2016-10-06 DIAGNOSIS — E860 Dehydration: Secondary | ICD-10-CM | POA: Diagnosis not present

## 2016-10-06 DIAGNOSIS — F17210 Nicotine dependence, cigarettes, uncomplicated: Secondary | ICD-10-CM | POA: Insufficient documentation

## 2016-10-06 DIAGNOSIS — D62 Acute posthemorrhagic anemia: Secondary | ICD-10-CM | POA: Diagnosis not present

## 2016-10-06 DIAGNOSIS — Y92018 Other place in single-family (private) house as the place of occurrence of the external cause: Secondary | ICD-10-CM | POA: Insufficient documentation

## 2016-10-06 DIAGNOSIS — W109XXA Fall (on) (from) unspecified stairs and steps, initial encounter: Secondary | ICD-10-CM | POA: Diagnosis not present

## 2016-10-06 DIAGNOSIS — E871 Hypo-osmolality and hyponatremia: Secondary | ICD-10-CM

## 2016-10-06 DIAGNOSIS — S42292A Other displaced fracture of upper end of left humerus, initial encounter for closed fracture: Secondary | ICD-10-CM

## 2016-10-06 DIAGNOSIS — I10 Essential (primary) hypertension: Secondary | ICD-10-CM | POA: Diagnosis not present

## 2016-10-06 DIAGNOSIS — W19XXXA Unspecified fall, initial encounter: Secondary | ICD-10-CM

## 2016-10-06 DIAGNOSIS — Z9181 History of falling: Secondary | ICD-10-CM | POA: Insufficient documentation

## 2016-10-06 DIAGNOSIS — E876 Hypokalemia: Principal | ICD-10-CM

## 2016-10-06 HISTORY — DX: Alcohol abuse, uncomplicated: F10.10

## 2016-10-06 HISTORY — DX: Inflammatory liver disease, unspecified: K75.9

## 2016-10-06 HISTORY — DX: Essential (primary) hypertension: I10

## 2016-10-06 LAB — HEPATIC FUNCTION PANEL
ALBUMIN: 3.1 g/dL — AB (ref 3.5–5.0)
ALK PHOS: 35 U/L — AB (ref 38–126)
ALT: 32 U/L (ref 17–63)
AST: 44 U/L — AB (ref 15–41)
BILIRUBIN TOTAL: 1.1 mg/dL (ref 0.3–1.2)
Bilirubin, Direct: 0.3 mg/dL (ref 0.1–0.5)
Indirect Bilirubin: 0.8 mg/dL (ref 0.3–0.9)
TOTAL PROTEIN: 6.8 g/dL (ref 6.5–8.1)

## 2016-10-06 LAB — ETHANOL: Alcohol, Ethyl (B): 95 mg/dL — ABNORMAL HIGH (ref <5)

## 2016-10-06 LAB — CBC
HCT: 24.1 % — ABNORMAL LOW (ref 40.0–52.0)
Hemoglobin: 7.8 g/dL — ABNORMAL LOW (ref 13.0–18.0)
MCH: 26.9 pg (ref 26.0–34.0)
MCHC: 32.3 g/dL (ref 32.0–36.0)
MCV: 83.3 fL (ref 80.0–100.0)
PLATELETS: 244 10*3/uL (ref 150–440)
RBC: 2.9 MIL/uL — AB (ref 4.40–5.90)
RDW: 20.1 % — ABNORMAL HIGH (ref 11.5–14.5)
WBC: 6.4 10*3/uL (ref 3.8–10.6)

## 2016-10-06 LAB — BASIC METABOLIC PANEL
Anion gap: 12 (ref 5–15)
BUN: 12 mg/dL (ref 6–20)
CO2: 24 mmol/L (ref 22–32)
Calcium: 8.5 mg/dL — ABNORMAL LOW (ref 8.9–10.3)
Chloride: 94 mmol/L — ABNORMAL LOW (ref 101–111)
Creatinine, Ser: 0.55 mg/dL — ABNORMAL LOW (ref 0.61–1.24)
GFR calc Af Amer: >60 mL/min (ref >60)
GFR calc non Af Amer: >60 mL/min (ref >60)
GLUCOSE: 99 mg/dL (ref 65–99)
POTASSIUM: 2.8 mmol/L — AB (ref 3.5–5.1)
Sodium: 130 mmol/L — ABNORMAL LOW (ref 135–145)

## 2016-10-06 LAB — MAGNESIUM: Magnesium: 1.8 mg/dL (ref 1.7–2.4)

## 2016-10-06 LAB — IRON AND TIBC
Iron: 13 ug/dL — ABNORMAL LOW (ref 45–182)
Saturation Ratios: 4 % — ABNORMAL LOW (ref 17.9–39.5)
TIBC: 335 ug/dL (ref 250–450)
UIBC: 322 ug/dL

## 2016-10-06 LAB — MRSA PCR SCREENING: MRSA by PCR: NEGATIVE

## 2016-10-06 LAB — POTASSIUM: Potassium: 3.5 mmol/L (ref 3.5–5.1)

## 2016-10-06 MED ORDER — ADULT MULTIVITAMIN W/MINERALS CH
1.0000 | ORAL_TABLET | Freq: Every day | ORAL | Status: DC
  Administered 2016-10-06 – 2016-10-09 (×4): 1 via ORAL
  Filled 2016-10-06 (×4): qty 1

## 2016-10-06 MED ORDER — MORPHINE SULFATE (PF) 4 MG/ML IV SOLN
4.0000 mg | Freq: Once | INTRAVENOUS | Status: AC
  Administered 2016-10-06: 4 mg via INTRAVENOUS
  Filled 2016-10-06: qty 1

## 2016-10-06 MED ORDER — FOLIC ACID 1 MG PO TABS
1.0000 mg | ORAL_TABLET | Freq: Every day | ORAL | Status: DC
  Administered 2016-10-06 – 2016-10-09 (×4): 1 mg via ORAL
  Filled 2016-10-06 (×4): qty 1

## 2016-10-06 MED ORDER — ONDANSETRON HCL 4 MG/2ML IJ SOLN
4.0000 mg | Freq: Once | INTRAMUSCULAR | Status: AC
  Administered 2016-10-06: 4 mg via INTRAVENOUS
  Filled 2016-10-06: qty 2

## 2016-10-06 MED ORDER — BACITRACIN ZINC 500 UNIT/GM EX OINT
TOPICAL_OINTMENT | CUTANEOUS | Status: AC
  Filled 2016-10-06: qty 0.9

## 2016-10-06 MED ORDER — VITAMIN B-1 100 MG PO TABS
100.0000 mg | ORAL_TABLET | Freq: Every day | ORAL | Status: DC
  Administered 2016-10-06 – 2016-10-09 (×3): 100 mg via ORAL
  Filled 2016-10-06 (×4): qty 1

## 2016-10-06 MED ORDER — HYDROCODONE-ACETAMINOPHEN 5-325 MG PO TABS
1.0000 | ORAL_TABLET | ORAL | Status: DC | PRN
  Administered 2016-10-06 (×2): 2 via ORAL
  Administered 2016-10-06: 1 via ORAL
  Administered 2016-10-07 (×2): 2 via ORAL
  Administered 2016-10-08 – 2016-10-09 (×3): 1 via ORAL
  Administered 2016-10-09: 2 via ORAL
  Filled 2016-10-06: qty 2
  Filled 2016-10-06 (×2): qty 1
  Filled 2016-10-06: qty 2
  Filled 2016-10-06 (×2): qty 1
  Filled 2016-10-06: qty 2
  Filled 2016-10-06 (×2): qty 1
  Filled 2016-10-06: qty 2

## 2016-10-06 MED ORDER — LORAZEPAM 2 MG PO TABS
0.0000 mg | ORAL_TABLET | Freq: Two times a day (BID) | ORAL | Status: DC
Start: 2016-10-08 — End: 2016-10-09

## 2016-10-06 MED ORDER — LORAZEPAM 2 MG PO TABS
0.0000 mg | ORAL_TABLET | Freq: Four times a day (QID) | ORAL | Status: AC
  Administered 2016-10-07: 2 mg via ORAL
  Filled 2016-10-06: qty 1

## 2016-10-06 MED ORDER — KETOROLAC TROMETHAMINE 15 MG/ML IJ SOLN: 15.0000 mg | Freq: Four times a day (QID) | INTRAMUSCULAR | Status: DC | PRN

## 2016-10-06 MED ORDER — LORAZEPAM 2 MG/ML IJ SOLN: 1.0000 mg | Freq: Four times a day (QID) | INTRAMUSCULAR | Status: AC | PRN

## 2016-10-06 MED ORDER — LORAZEPAM 1 MG PO TABS: 1.0000 mg | ORAL_TABLET | Freq: Four times a day (QID) | ORAL | Status: AC | PRN

## 2016-10-06 MED ORDER — ONDANSETRON HCL 4 MG PO TABS
4.0000 mg | ORAL_TABLET | Freq: Four times a day (QID) | ORAL | Status: DC | PRN
Start: 2016-10-06 — End: 2016-10-09

## 2016-10-06 MED ORDER — SENNOSIDES-DOCUSATE SODIUM 8.6-50 MG PO TABS: 1.0000 | ORAL_TABLET | Freq: Every evening | ORAL | Status: DC | PRN

## 2016-10-06 MED ORDER — MORPHINE SULFATE (PF) 2 MG/ML IV SOLN: 2.0000 mg | INTRAVENOUS | Status: DC | PRN

## 2016-10-06 MED ORDER — POTASSIUM CHLORIDE CRYS ER 20 MEQ PO TBCR
40.0000 meq | EXTENDED_RELEASE_TABLET | Freq: Once | ORAL | Status: AC
  Administered 2016-10-06: 40 meq via ORAL
  Filled 2016-10-06: qty 2

## 2016-10-06 MED ORDER — ACETAMINOPHEN 325 MG PO TABS: 650.0000 mg | ORAL_TABLET | Freq: Four times a day (QID) | ORAL | Status: DC | PRN

## 2016-10-06 MED ORDER — ONDANSETRON HCL 4 MG/2ML IJ SOLN: 4.0000 mg | Freq: Four times a day (QID) | INTRAMUSCULAR | Status: DC | PRN

## 2016-10-06 MED ORDER — FERROUS SULFATE 325 (65 FE) MG PO TABS
325.0000 mg | ORAL_TABLET | Freq: Two times a day (BID) | ORAL | Status: DC
  Administered 2016-10-06 – 2016-10-09 (×6): 325 mg via ORAL
  Filled 2016-10-06 (×6): qty 1

## 2016-10-06 MED ORDER — THIAMINE HCL 100 MG/ML IJ SOLN
100.0000 mg | Freq: Every day | INTRAMUSCULAR | Status: DC
  Administered 2016-10-07: 100 mg via INTRAVENOUS
  Filled 2016-10-06: qty 2

## 2016-10-06 MED ORDER — POTASSIUM CHLORIDE IN NACL 20-0.9 MEQ/L-% IV SOLN
INTRAVENOUS | Status: DC
  Administered 2016-10-06 (×2): via INTRAVENOUS
  Filled 2016-10-06 (×5): qty 1000

## 2016-10-06 MED ORDER — ACETAMINOPHEN 650 MG RE SUPP: 650.0000 mg | Freq: Four times a day (QID) | RECTAL | Status: DC | PRN

## 2016-10-06 MED ORDER — NICOTINE 21 MG/24HR TD PT24
21.0000 mg | MEDICATED_PATCH | Freq: Every day | TRANSDERMAL | Status: DC
  Administered 2016-10-06 – 2016-10-09 (×4): 21 mg via TRANSDERMAL
  Filled 2016-10-06 (×4): qty 1

## 2016-10-06 NOTE — ED Notes (Signed)
Pt changed and peri care given due to saturated jeans on pt.

## 2016-10-06 NOTE — Care Management (Signed)
Patient was recently released from jail and is homeless.  Fell and fractured his left humerus.  ETOH abuse.  On CIWA and so far is scoring 0. Patient does not participate with conversation with care management.   It is verbally reported that patient may require surgery, but ortho consult is pending.  CSW aware.

## 2016-10-06 NOTE — Evaluation (Addendum)
Occupational Therapy Evaluation Patient Details Name: Richard LoosenRonnie Graves MRN: [id number] DOB: 02/10/1950 Today's Date: 10/06/2016    History of Present Illness Pt. is a 66 y.o. male who was [facility] With an old Proximal Humerus Fracture Type 4 with abundant Callous Formation. Pt. PMHx includes: Alcohol Abuse, Hepatitis, and HTN.   Clinical Impression   Pt. is a 66 y.o. Male who [facility] with a Proximal Humerus Fracture. Imaging revealed an old Proximal Humerus Fracture Type 4 with abundant callous formation. No surgery is recommended at this time. Pt. was lethargic during the session, with very limited verbal communication. Pt. was referred to OT services for gentle ROM. Pt. has very limited shoulder flexion, and abduction. Pt. Tolerated gentle PROM to the shoulder for flexion, and abduction, elbow, flexion, and extension, wrist/digits. Pt. presents with overall lethargy, weakness, immobilized LUE, pain, limited functional mobility, and limited engagement in functional tasks. Pt. Would benefit from skilled OT services for gentle ROM, and ADL training.  Per case management, Pt. Was recently released from jail, and is homeless. Pt. would benefit from SNF upon discharge.    Follow Up Recommendations  SNF    Equipment Recommendations       Recommendations for Other Services       Precautions / Restrictions  Immobilizer in place: may start to wean over the next few days.  LUE NWB            ADL either performed or assessed with clinical judgement   ADL Overall ADL's : Needs assistance/impaired Eating/Feeding: Set up;Minimal assistance   Grooming: Set up;Minimal assistance   Upper Body Bathing: Maximal assistance   Lower Body Bathing: Maximal assistance   Upper Body Dressing : Maximal assistance   Lower Body Dressing: Maximal assistance               Functional mobility during ADLs:  (Mobility deferred)       Vision Baseline Vision/History:  (Unable to  assess)       Perception     Praxis      Pertinent Vitals/Pain Pain Assessment: Faces (Pt. unable to rate) Pain Score: 4  Pain Location: Left UE     Hand Dominance Right   Extremity/Trunk Assessment Upper Extremity Assessment Upper Extremity Assessment: LUE deficits/detail LUE: Unable to fully assess due to immobilization           Communication Communication Communication:  (Limited communication, and verblization during session)   Cognition Arousal/Alertness: Lethargic Behavior During Therapy: Flat affect Overall Cognitive Status: No family/caregiver present to determine baseline cognitive functioning                                     General Comments       Exercises     Shoulder Instructions      Home Living Family/patient expects to be discharged to:: Unsure                                 Additional Comments: Per care management, pt. was recently released from jail, and is homeless.      Prior Functioning/Environment Level of Independence: Independent                 OT Problem List: Decreased strength;Pain;Impaired UE functional use;Decreased range of motion;Decreased cognition      OT Treatment/Interventions: Self-care/ADL training;Therapeutic  exercise;Patient/family education;Therapeutic activities;DME and/or AE instruction    OT Goals(Current goals can be found in the care plan section) Acute Rehab OT Goals Patient Stated Goal: Unable to participate in goal setting  OT Frequency: Min 2X/week   Barriers to D/C:            Co-evaluation              AM-PAC PT "6 Clicks" Daily Activity     Outcome Measure Help from another person eating meals?: A Little Help from another person taking care of personal grooming?: A Little Help from another person toileting, which includes using toliet, bedpan, or urinal?: A Lot Help from another person bathing (including washing, rinsing, drying)?: A Lot Help from  another person to put on and taking off regular upper body clothing?: A Lot Help from another person to put on and taking off regular lower body clothing?: A Lot 6 Click Score: 14   End of Session    Activity Tolerance: Patient limited by lethargy Patient left: in bed;with call bell/phone within reach;with bed alarm set  OT Visit Diagnosis: History of falling (Z91.81)                Time: 1400-1415 OT Time Calculation (min): 15 min Charges:  OT General Charges $OT Visit: 1 Visit OT Evaluation $OT Eval Moderate Complexity: 1 Mod G-Codes: OT G-codes **NOT FOR INPATIENT CLASS** Functional Limitation: Self care Self Care Current Status (W0981): At least 40 percent but less than 60 percent impaired, limited or restricted Self Care Goal Status (X9147): At least 1 percent but less than 20 percent impaired, limited or restricted   Olegario Messier, MS, OTR/L   Olegario Messier, MS, OTR/L 10/06/2016, 3:18 PM

## 2016-10-06 NOTE — ED Notes (Signed)
EDP aware of hgb, no new orders at this time.

## 2016-10-06 NOTE — Consult Note (Signed)
ORTHOPAEDIC CONSULTATION  REQUESTING PHYSICIAN: Altamese Dilling, *  Chief Complaint: left shoulder pain  HPI: Richard Graves is a 66 y.o. male who complains of  Mild left shoulder pain. He injured his shoulder over a month ago but does not recall when or how. He denies any numbness or tingling. No other complaints.  Past Medical History:  Diagnosis Date  . Alcohol abuse   . Hepatitis   . Hypertension    Past Surgical History:  Procedure Laterality Date  . HIP SURGERY     Social History   Social History  . Marital status: Single    Spouse name: N/A  . Number of children: N/A  . Years of education: N/A   Occupational History  . unemployed    Social History Main Topics  . Smoking status: Current Every Day Smoker    Packs/day: 1.00    Years: 53.00  . Smokeless tobacco: Never Used  . Alcohol use Yes     Comment: Pt states "drinks beer everyday"  . Drug use: No  . Sexual activity: Not Asked   Other Topics Concern  . None   Social History Narrative  . None   Family History  Problem Relation Age of Onset  . CAD Neg Hx   . Diabetes Neg Hx   . Hypertension Neg Hx    Not on File Prior to Admission medications   Not on File   Ct Head Wo Contrast  Result Date: 10/06/2016 CLINICAL DATA:  Larey Seat downstairs. No loss of consciousness. LEFT arm pain. History of hypertension and alcohol abuse. EXAM: CT HEAD WITHOUT CONTRAST CT CERVICAL SPINE WITHOUT CONTRAST TECHNIQUE: Multidetector CT imaging of the head and cervical spine was performed following the standard protocol without intravenous contrast. Multiplanar CT image reconstructions of the cervical spine were also generated. COMPARISON:  CT HEAD September 21, 2016 FINDINGS: CT HEAD FINDINGS BRAIN: No intraparenchymal hemorrhage, mass effect nor midline shift. Moderate to severe parenchymal brain volume loss. No hydrocephalus. Old bilateral basal ganglia infarcts with ex vacuo dilatation LEFT frontal horn of the lateral  ventricle. Patchy supratentorial white matter hypodensities unchanged. No abnormal extra-axial fluid collections. VASCULAR: Moderate to severe calcific atherosclerosis of the carotid siphons. SKULL: No skull fracture. Osteopenia. No significant scalp soft tissue swelling. SINUSES/ORBITS: Partially imaged chronic RIGHT maxillary sinusitis with bony remodeling. Small LEFT mastoid effusion. The included ocular globes and orbital contents are non-suspicious. OTHER: None. CT CERVICAL SPINE FINDINGS ALIGNMENT: Maintained lordosis. Vertebral bodies in alignment. SKULL BASE AND VERTEBRAE: Cervical vertebral bodies and posterior elements are intact. Severe C5-6 and C6-7 disc height loss with endplate spurring compatible with degenerative discs, moderate C3-4 and C4-5. C1-2 articulation maintained with milder at myopathy. Osteopenia without destructive bony lesions. Multilevel moderate facet arthropathy. Old RIGHT clavicle fracture. SOFT TISSUES AND SPINAL CANAL: Nonacute. Severe RIGHT and moderate LEFT calcific atherosclerosis carotid bifurcations. Dense proteinaceous versus hemorrhagic 7 mm skin nodule RIGHT lower face. DISC LEVELS: No significant osseous canal stenosis. Moderate C4-5 through C6-7 neural foraminal narrowing. UPPER CHEST: Biapical emphysema. OTHER: None. IMPRESSION: CT HEAD: 1. No acute intracranial process. 2. Stable moderate to severe atrophy. 3. 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 CT CERVICAL SPINE: 1. No acute fracture or malalignment. 2. Advanced atherosclerosis carotid bifurcations may result in hemodynamically significant stenosis. Consider carotid ultrasound versus CT angiogram of the neck on a nonemergent basis. Electronically Signed   By: Awilda Metro M.D.   On: 10/06/2016 04:54   Ct Cervical Spine Wo  Contrast  Result Date: 10/06/2016 CLINICAL DATA:  Larey SeatFell downstairs. No loss of consciousness. LEFT arm pain. History of hypertension and alcohol  abuse. EXAM: CT HEAD WITHOUT CONTRAST CT CERVICAL SPINE WITHOUT CONTRAST TECHNIQUE: Multidetector CT imaging of the head and cervical spine was performed following the standard protocol without intravenous contrast. Multiplanar CT image reconstructions of the cervical spine were also generated. COMPARISON:  CT HEAD September 21, 2016 FINDINGS: CT HEAD FINDINGS BRAIN: No intraparenchymal hemorrhage, mass effect nor midline shift. Moderate to severe parenchymal brain volume loss. No hydrocephalus. Old bilateral basal ganglia infarcts with ex vacuo dilatation LEFT frontal horn of the lateral ventricle. Patchy supratentorial white matter hypodensities unchanged. No abnormal extra-axial fluid collections. VASCULAR: Moderate to severe calcific atherosclerosis of the carotid siphons. SKULL: No skull fracture. Osteopenia. No significant scalp soft tissue swelling. SINUSES/ORBITS: Partially imaged chronic RIGHT maxillary sinusitis with bony remodeling. Small LEFT mastoid effusion. The included ocular globes and orbital contents are non-suspicious. OTHER: None. CT CERVICAL SPINE FINDINGS ALIGNMENT: Maintained lordosis. Vertebral bodies in alignment. SKULL BASE AND VERTEBRAE: Cervical vertebral bodies and posterior elements are intact. Severe C5-6 and C6-7 disc height loss with endplate spurring compatible with degenerative discs, moderate C3-4 and C4-5. C1-2 articulation maintained with milder at myopathy. Osteopenia without destructive bony lesions. Multilevel moderate facet arthropathy. Old RIGHT clavicle fracture. SOFT TISSUES AND SPINAL CANAL: Nonacute. Severe RIGHT and moderate LEFT calcific atherosclerosis carotid bifurcations. Dense proteinaceous versus hemorrhagic 7 mm skin nodule RIGHT lower face. DISC LEVELS: No significant osseous canal stenosis. Moderate C4-5 through C6-7 neural foraminal narrowing. UPPER CHEST: Biapical emphysema. OTHER: None. IMPRESSION: CT HEAD: 1. No acute intracranial process. 2. Stable moderate  to severe atrophy. 3. 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 CT CERVICAL SPINE: 1. No acute fracture or malalignment. 2. Advanced atherosclerosis carotid bifurcations may result in hemodynamically significant stenosis. Consider carotid ultrasound versus CT angiogram of the neck on a nonemergent basis. Electronically Signed   By: Awilda Metroourtnay  Bloomer M.D.   On: 10/06/2016 04:54   Dg Shoulder Left  Result Date: 10/06/2016 CLINICAL DATA:  Ecchymosis and deformity after falling on stairs tonight EXAM: LEFT SHOULDER - 2+ VIEW COMPARISON:  None. FINDINGS: There is a proximal left humeral fracture with marked anterior displacement and mild comminution. The humeral head fragment is rotated but probably remains in contact with the glenoid. Multiple old left rib fractures. Irregularity of the distal clavicle is probably due to remote healed fracture. IMPRESSION: Comminuted displaced proximal left humeral fracture. Electronically Signed   By: Ellery Plunkaniel R Mitchell M.D.   On: 10/06/2016 02:56   Dg Humerus Left  Result Date: 10/06/2016 CLINICAL DATA:  Ecchymosis and deformity after falling on stairs tonight. EXAM: LEFT HUMERUS - 2+ VIEW COMPARISON:  None. FINDINGS: There is an acute proximal left humeral fracture with marked anterior displacement and mild comminution. The humeral head fragment is rotated although it does appear to still be in contact with the glenoid. No evidence of a pathologic basis for the fracture. IMPRESSION: Comminuted markedly displaced proximal left humeral fracture. Remainder of the humerus appears intact. Electronically Signed   By: Ellery Plunkaniel R Mitchell M.D.   On: 10/06/2016 02:56    Positive ROS: All other systems have been reviewed and were otherwise negative with the exception of those mentioned in the HPI and as above.  Physical Exam: General: Alert, no acute distress Cardiovascular: No pedal edema Respiratory: No cyanosis, no use of accessory  musculature GI: No organomegaly, abdomen is soft and non-tender Skin:  No lesions in the area of chief complaint Neurologic: Sensation intact distally Psychiatric: Patient is competent for consent with normal mood and affect Lymphatic: No axillary or cervical lymphadenopathy  MUSCULOSKELETAL: left shoulder in an immobilizer, moderate swelling about the shoulder girdle. Limited ROM 20 degrees FF, 30 degrees of abduction and 70 degrees of internal rotation, minimally tender, motor and sensory are intact in the median, radial and ulnar distribution.  Assessment: Old left proximal humerus type 4 fracture with abundant bony callous formation  Plan: The fracture is old and I would not recommend any surgery at this time. Symptomatic treatment at this time and he may wean out of the shoulder immobilizer over the next few days. Gentle ROM, will order OT. He may follow up in 2 to 3 weeks with me at [phone] for discussion of any further treatment in the future, although he is a poor surgical candidate. All of his questions are answered. Please call with questions.    Lyndle Herrlich, MD    10/06/2016 12:03 PM

## 2016-10-06 NOTE — ED Triage Notes (Signed)
Per EMS, pt fell onto left arm/shoulder with possible deformity, bruising noted to left shoulder down arm. Pt states he did have "2 big beers tonight." Pt states he fell on stairs, denies LOC however pt states he does not know what time this fall occurred.

## 2016-10-06 NOTE — Clinical Social Work Note (Signed)
CSW received consult that patient is homeless.  CSW met with patient and gave him information on housing resources, homeless shelter, [employer], and boarding housing.  Patient states he gets disability, CSW also informed patient that if PT and OT work with him to recommend SNF placement CSW can assist with trying to find SNF placement for rehab for patient.  Patient expressed his gratitude for information provided.  CSW to continue to follow in case patient needs SNF placement.  Jones Broom. Wall, MSW, [place]  10/06/2016 3:00pm

## 2016-10-06 NOTE — ED Notes (Signed)
Pt back from CT

## 2016-10-06 NOTE — Clinical Social Work Note (Signed)
Clinical Social Work Assessment  Patient Details  Name: Brandt LoosenRonnie Fehringer MRN: [id number] Date of Birth: 06/16/1950  Date of referral:  10/06/16               Reason for consult:  Facility Placement, Housing Concerns/Homelessness, [employer]                Permission sought to share information with:  Facility Industrial/product designerContact Representative Permission granted to share information::  Yes, Verbal Permission Granted  Name::        Agency::  SNF admissions  Relationship::     Contact Information:     Housing/Transportation Living arrangements for the past 2 months:  [facility], Homeless Source of Information:  Patient Patient Interpreter Needed:  None Criminal Activity/Legal Involvement Pertinent to Current Situation/Hospitalization:  Yes Significant Relationships:  None Lives with:  Self Do you feel safe going back to the place where you live?  No Need for family participation in patient care:  No (Coment)  Care giving concerns:  Patient is in agreement to going SNF if needed or going to shelter.  Social Worker assessment / plan:  Patient is a 66 year old male who was recently released from prison.  Patient states he was staying with a friend, however patient does not know if he can return back to his friends house.  Patient states he has been to rehab before, CSW explained to patient depending on what therapy says, will determine if he will be able to go to SNF or not.  Patient was given information on local homeless shelters, boarding, house, and [employer] resources.  Patient expressed that if he needs to go to SNF he is in agreement to going depending on PT recommendations.  Patient gave CSW permission to monitor in case patient needs SNF placement, patient did not express any other questions.    Employment status:  Retired, Disabled (Comment on whether or not currently receiving Disability) Insurance information:  Medicare PT Recommendations:  Skilled Nursing Facility                       Information / Referral to community resources:     Patient/Family's Response to care:  Patient is in agreement to going to SNF if PT is recommended.  Patient/Family's Understanding of and Emotional Response to Diagnosis, Current Treatment, and Prognosis:  Patient is aware of current treatment plan an                   Emotional Assessment Appearance:   Awake and listening towaooooo Attitude/Demeanor/Rapport:    Affect (typically observed):  Accepting, Calm Orientation:  Oriented to Self, Oriented to Place, Oriented to  Time, Oriented to Situation Alcohol / Substance use:  Alcohol Use, Illicit Drugs Psych involvement (Current and /or in the community):  No (Comment)  Discharge Needs  Concerns to be addressed:  Homelessness, Lack of Support Readmission within the last 30 days:  No Current discharge risk:  Lack of support system, Homeless Barriers to Discharge:  Continued Medical Work up   [place] [employer], Echo Propp R, LCSWA 10/06/2016, 5:01 PM

## 2016-10-06 NOTE — H&P (Signed)
[facility] [place] [facility]   PATIENT NAME: Richard Graves    MR#:  [id number]  DATE OF BIRTH:  31-May-1950  DATE OF ADMISSION:  10/06/2016  PRIMARY CARE PHYSICIAN: Patient, No Pcp Per   REQUESTING/REFERRING PHYSICIAN:   CHIEF COMPLAINT:   Chief Complaint  Patient presents with  . Fall  . Arm Injury    Left    HISTORY OF PRESENT ILLNESS: Richard Graves  is a 66 y.o. male with a known history of alcohol abuse, hepatitis, hypertension presented to the emergency room after he sustained a fall on his neighbor's porch.No history of any head injury. Patient fell and landed on his left shoulder. Left shoulder is swollen and tender. Pain is aching in nature 7 out of 10 on a scale of 1-10. Patient was evaluated with x-ray of the left shoulder and arm which showed humerus fracture. Case was discussed with orthopedic attending recommended sling for now. Patient's alcohol level is elevated. He drinks alcohol on a regular basis. No complaints of any chest pain, shortness of breath. No complaints of any abdominal pain. Potassium level is also low during the workup.  PAST MEDICAL HISTORY:   Past Medical History:  Diagnosis Date  . Alcohol abuse   . Hepatitis   . Hypertension     PAST SURGICAL HISTORY: Past Surgical History:  Procedure Laterality Date  . HIP SURGERY      SOCIAL HISTORY:  Social History  Substance Use Topics  . Smoking status: Current Every Day Smoker    Packs/day: 1.00    Years: 53.00  . Smokeless tobacco: Never Used  . Alcohol use Yes     Comment: Pt states "drinks beer everyday"    FAMILY HISTORY:  Family History  Problem Relation Age of Onset  . CAD Neg Hx   . Diabetes Neg Hx   . Hypertension Neg Hx     DRUG ALLERGIES: Not on File  REVIEW OF SYSTEMS:   CONSTITUTIONAL: No fever, fatigue or weakness.  EYES: No blurred or double vision.  EARS, NOSE, AND THROAT: No tinnitus or ear pain.  RESPIRATORY: No cough, shortness of breath,  wheezing or hemoptysis.  CARDIOVASCULAR: No chest pain, orthopnea, edema.  GASTROINTESTINAL: No nausea, vomiting, diarrhea or abdominal pain.  GENITOURINARY: No dysuria, hematuria.  ENDOCRINE: No polyuria, nocturia,  HEMATOLOGY: No anemia, easy bruising or bleeding SKIN: No rash or lesion. MUSCULOSKELETAL: Has left shoulder swelling and pain. NEUROLOGIC: No tingling, numbness, weakness.  PSYCHIATRY: No anxiety or depression.   MEDICATIONS AT HOME:  Prior to Admission medications   Not on File      PHYSICAL EXAMINATION:   VITAL SIGNS: Blood pressure 126/80, pulse 76, temperature 98.3 F (36.8 C), temperature source Oral, resp. rate 20, height 5\' 7"  (1.702 m), weight 56.7 kg (125 lb), SpO2 96 %.  GENERAL:  66 y.o.-year-old patient lying in the bed with no acute distress.  EYES: Pupils equal, round, reactive to light and accommodation. No scleral icterus. Extraocular muscles intact.  HEENT: Head atraumatic, normocephalic. Oropharynx and nasopharynx clear.  NECK:  Supple, no jugular venous distention. No thyroid enlargement, no tenderness.  LUNGS: Normal breath sounds bilaterally, no wheezing, rales,rhonchi or crepitation. No use of accessory muscles of respiration.  CARDIOVASCULAR: S1, S2 normal. No murmurs, rubs, or gallops.  ABDOMEN: Soft, nontender, nondistended. Bowel sounds present. No organomegaly or mass.  EXTREMITIES: No pedal edema, cyanosis, or clubbing.  Left shoulder swollen Tenderness left shoulder and left arm NEUROLOGIC: Cranial nerves II  through XII are intact. Muscle strength 5/5 in all extremities. Sensation intact. Gait not checked.  PSYCHIATRIC: The patient is alert and oriented x 3.  SKIN: No obvious rash, lesion, or ulcer.   LABORATORY PANEL:   CBC  Recent Labs Lab 10/06/16 0158  WBC 6.4  HGB 7.8*  HCT 24.1*  PLT 244  MCV 83.3  MCH 26.9  MCHC 32.3  RDW 20.1*    ------------------------------------------------------------------------------------------------------------------  Chemistries   Recent Labs Lab 10/06/16 0158  NA 130*  K 2.8*  CL 94*  CO2 24  GLUCOSE 99  BUN 12  CREATININE 0.55*  CALCIUM 8.5*   ------------------------------------------------------------------------------------------------------------------ estimated creatinine clearance is 73.8 mL/min (A) (by C-G formula based on SCr of 0.55 mg/dL (L)). ------------------------------------------------------------------------------------------------------------------ No results for input(s): TSH, T4TOTAL, T3FREE, THYROIDAB in the last 72 hours.  Invalid input(s): FREET3   Coagulation profile No results for input(s): INR, PROTIME in the last 168 hours. ------------------------------------------------------------------------------------------------------------------- No results for input(s): DDIMER in the last 72 hours. -------------------------------------------------------------------------------------------------------------------  Cardiac Enzymes No results for input(s): CKMB, TROPONINI, MYOGLOBIN in the last 168 hours.  Invalid input(s): CK ------------------------------------------------------------------------------------------------------------------ Invalid input(s): POCBNP  ---------------------------------------------------------------------------------------------------------------  Urinalysis No results found for: COLORURINE, APPEARANCEUR, LABSPEC, PHURINE, GLUCOSEU, HGBUR, BILIRUBINUR, KETONESUR, PROTEINUR, UROBILINOGEN, NITRITE, LEUKOCYTESUR   RADIOLOGY: Dg Shoulder Left  Result Date: 10/06/2016 CLINICAL DATA:  Ecchymosis and deformity after falling on stairs tonight EXAM: LEFT SHOULDER - 2+ VIEW COMPARISON:  None. FINDINGS: There is a proximal left humeral fracture with marked anterior displacement and mild comminution. The humeral head fragment is  rotated but probably remains in contact with the glenoid. Multiple old left rib fractures. Irregularity of the distal clavicle is probably due to remote healed fracture. IMPRESSION: Comminuted displaced proximal left humeral fracture. Electronically Signed   By: Ellery Plunkaniel R Mitchell M.D.   On: 10/06/2016 02:56   Dg Humerus Left  Result Date: 10/06/2016 CLINICAL DATA:  Ecchymosis and deformity after falling on stairs tonight. EXAM: LEFT HUMERUS - 2+ VIEW COMPARISON:  None. FINDINGS: There is an acute proximal left humeral fracture with marked anterior displacement and mild comminution. The humeral head fragment is rotated although it does appear to still be in contact with the glenoid. No evidence of a pathologic basis for the fracture. IMPRESSION: Comminuted markedly displaced proximal left humeral fracture. Remainder of the humerus appears intact. Electronically Signed   By: Ellery Plunkaniel R Mitchell M.D.   On: 10/06/2016 02:56    EKG: No orders found for this or any previous visit.  IMPRESSION AND PLAN: 66 year old male patient with history of alcohol abuse, hepatitis presented to the emergency room with fall and left shoulder pain. Admitting diagnosis 1. Left humerus fracture 2.Hypokalemia 3.Hyponatremia 4. Dehydration 5. Alcohol abuse 6.Anemia Treatment plan Admit patient to medical floor Replace potassium IV fluid hydration Orthopedic surgery consultation for humerus fracture Follow up CT head Pain management with IV morphine ciwa protocol for alcohol withdrawal Sequential compression devices to lower extremities for DVT prophylaxis  All the records are reviewed and case discussed with ED provider. Management plans discussed with the patient, family and they are in agreement.  CODE STATUS:FULL CODE Code Status History    This patient does not have a recorded code status. Please follow your organizational policy for patients in this situation.       TOTAL TIME TAKING CARE OF THIS  PATIENT: 51 minutes.    Ihor AustinPavan Pyreddy M.D on 10/06/2016 at 4:39 AM  Between 7am to 6pm - Pager - [phone]  After 6pm go to [email or web address] -  password [email or web address] [facility]  [facility]  Office  [phone]  CC: Primary care physician; Patient, No Pcp Per

## 2016-10-06 NOTE — ED Provider Notes (Addendum)
[facility] Emergency Department Provider Note   ____________________________________________   First MD Initiated Contact with Patient 10/06/16 0201     (approximate)  I have reviewed the triage vital signs and the nursing notes.   HISTORY  Chief Complaint Fall and Arm Injury (Left)    HPI Richard Graves is a 66 y.o. male who comes into the hospital today with a fall. The patient was just released from jail today after trespassing. He was found on someone's porch. The patient told the nurses that he was trying to get out of the rain. He reports that he had drank too big beers and he fell while on the porch. He denies hitting his head and states that he just tripped. He reports that his left arm is broken. He states he cannot move it and his pain is a 10 out of 10 in intensity. The patient was seen in the emergency department on August 23 as well as on August 16. He has a history of alcohol intoxication and his last alcohol was 498.   Past Medical History:  Diagnosis Date  . Alcohol abuse   . Hepatitis   . Hypertension     Patient Active Problem List   Diagnosis Date Noted  . Hypokalemia 10/06/2016    Past Surgical History:  Procedure Laterality Date  . HIP SURGERY      Prior to Admission medications   Not on File    Allergies Patient has no allergy information on record.  Family History  Problem Relation Age of Onset  . CAD Neg Hx   . Diabetes Neg Hx   . Hypertension Neg Hx     Social History Social History  Substance Use Topics  . Smoking status: Current Every Day Smoker    Packs/day: 1.00    Years: 53.00  . Smokeless tobacco: Never Used  . Alcohol use Yes     Comment: Pt states "drinks beer everyday"    Review of Systems  Constitutional: No fever/chills Eyes: No visual changes. ENT: No sore throat. Cardiovascular: Denies chest pain. Respiratory: Denies shortness of breath. Gastrointestinal: No abdominal pain.  No  nausea, no vomiting.  No diarrhea.  No constipation. Genitourinary: Negative for dysuria. Musculoskeletal: left arm pain Skin: Bruising to the left shoulder and upper arm Neurological: mild confusion, Negative for headaches, focal weakness or numbness.   ____________________________________________   PHYSICAL EXAM:  VITAL SIGNS: ED Triage Vitals  Enc Vitals Group     BP 10/06/16 0141 (!) 143/86     Pulse Rate 10/06/16 0141 78     Resp 10/06/16 0141 (!) 21     Temp 10/06/16 0141 97.7 F (36.5 C)     Temp Source 10/06/16 0141 Oral     SpO2 10/06/16 0141 100 %     Weight 10/06/16 0142 125 lb (56.7 kg)     Height 10/06/16 0142 5\' 7"  (1.702 m)     Head Circumference --      Peak Flow --      Pain Score 10/06/16 0141 10     Pain Loc --      Pain Edu? --      Excl. in GC? --     Constitutional: somnolent and mildly confused. Ill appearing and in moderate distress. Eyes: Conjunctivae are normal. PERRL. EOMI. Head: Atraumatic. Nose: No congestion/rhinnorhea. Mouth/Throat: Mucous membranes are moist.  Oropharynx non-erythematous. Neck: No cervical spine tenderness to palpation. Cardiovascular: Normal rate, regular rhythm. Grossly normal heart sounds.  Good peripheral circulation. Respiratory: Normal respiratory effort.  No retractions. Lungs CTAB. Gastrointestinal: Soft and nontender. No distention. Positive bowel sounds Musculoskeletal: Soft tissue swelling and bruising to the left shoulder and arm is held against the patient's body the patient is able to move his fingers left radial pulse 2+ sensation intact. Neurologic:  slurred speech and language. No gross focal neurologic deficits are appreciated. No gait instability. Skin:  Skin is warm, dry and intact. Bruising to the left shoulder and upper arm Psychiatric: patient mildly confused.   ____________________________________________   LABS (all labs ordered are listed, but only abnormal results are displayed)  Labs  Reviewed  CBC - Abnormal; Notable for the following:       Result Value   RBC 2.90 (*)    Hemoglobin 7.8 (*)    HCT 24.1 (*)    RDW 20.1 (*)    All other components within normal limits  BASIC METABOLIC PANEL - Abnormal; Notable for the following:    Sodium 130 (*)    Potassium 2.8 (*)    Chloride 94 (*)    Creatinine, Ser 0.55 (*)    Calcium 8.5 (*)    All other components within normal limits  ETHANOL - Abnormal; Notable for the following:    Alcohol, Ethyl (B) 95 (*)    All other components within normal limits   ____________________________________________  EKG  none ____________________________________________  RADIOLOGY  Ct Head Wo Contrast  Result Date: 10/06/2016 CLINICAL DATA:  Larey Seat downstairs. No loss of consciousness. LEFT arm pain. History of hypertension and alcohol abuse. EXAM: CT HEAD WITHOUT CONTRAST CT CERVICAL SPINE WITHOUT CONTRAST TECHNIQUE: Multidetector CT imaging of the head and cervical spine was performed following the standard protocol without intravenous contrast. Multiplanar CT image reconstructions of the cervical spine were also generated. COMPARISON:  CT HEAD September 21, 2016 FINDINGS: CT HEAD FINDINGS BRAIN: No intraparenchymal hemorrhage, mass effect nor midline shift. Moderate to severe parenchymal brain volume loss. No hydrocephalus. Old bilateral basal ganglia infarcts with ex vacuo dilatation LEFT frontal horn of the lateral ventricle. Patchy supratentorial white matter hypodensities unchanged. No abnormal extra-axial fluid collections. VASCULAR: Moderate to severe calcific atherosclerosis of the carotid siphons. SKULL: No skull fracture. Osteopenia. No significant scalp soft tissue swelling. SINUSES/ORBITS: Partially imaged chronic RIGHT maxillary sinusitis with bony remodeling. Small LEFT mastoid effusion. The included ocular globes and orbital contents are non-suspicious. OTHER: None. CT CERVICAL SPINE FINDINGS ALIGNMENT: Maintained lordosis.  Vertebral bodies in alignment. SKULL BASE AND VERTEBRAE: Cervical vertebral bodies and posterior elements are intact. Severe C5-6 and C6-7 disc height loss with endplate spurring compatible with degenerative discs, moderate C3-4 and C4-5. C1-2 articulation maintained with milder at myopathy. Osteopenia without destructive bony lesions. Multilevel moderate facet arthropathy. Old RIGHT clavicle fracture. SOFT TISSUES AND SPINAL CANAL: Nonacute. Severe RIGHT and moderate LEFT calcific atherosclerosis carotid bifurcations. Dense proteinaceous versus hemorrhagic 7 mm skin nodule RIGHT lower face. DISC LEVELS: No significant osseous canal stenosis. Moderate C4-5 through C6-7 neural foraminal narrowing. UPPER CHEST: Biapical emphysema. OTHER: None. IMPRESSION: CT HEAD: 1. No acute intracranial process. 2. Stable moderate to severe atrophy. 3. 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 CT CERVICAL SPINE: 1. No acute fracture or malalignment. 2. Advanced atherosclerosis carotid bifurcations may result in hemodynamically significant stenosis. Consider carotid ultrasound versus CT angiogram of the neck on a nonemergent basis. Electronically Signed   By: Awilda Metro M.D.   On: 10/06/2016 04:54   Ct Cervical Spine Wo Contrast  Result Date: 10/06/2016 CLINICAL DATA:  Larey Seat downstairs. No loss of consciousness. LEFT arm pain. History of hypertension and alcohol abuse. EXAM: CT HEAD WITHOUT CONTRAST CT CERVICAL SPINE WITHOUT CONTRAST TECHNIQUE: Multidetector CT imaging of the head and cervical spine was performed following the standard protocol without intravenous contrast. Multiplanar CT image reconstructions of the cervical spine were also generated. COMPARISON:  CT HEAD September 21, 2016 FINDINGS: CT HEAD FINDINGS BRAIN: No intraparenchymal hemorrhage, mass effect nor midline shift. Moderate to severe parenchymal brain volume loss. No hydrocephalus. Old bilateral basal ganglia infarcts  with ex vacuo dilatation LEFT frontal horn of the lateral ventricle. Patchy supratentorial white matter hypodensities unchanged. No abnormal extra-axial fluid collections. VASCULAR: Moderate to severe calcific atherosclerosis of the carotid siphons. SKULL: No skull fracture. Osteopenia. No significant scalp soft tissue swelling. SINUSES/ORBITS: Partially imaged chronic RIGHT maxillary sinusitis with bony remodeling. Small LEFT mastoid effusion. The included ocular globes and orbital contents are non-suspicious. OTHER: None. CT CERVICAL SPINE FINDINGS ALIGNMENT: Maintained lordosis. Vertebral bodies in alignment. SKULL BASE AND VERTEBRAE: Cervical vertebral bodies and posterior elements are intact. Severe C5-6 and C6-7 disc height loss with endplate spurring compatible with degenerative discs, moderate C3-4 and C4-5. C1-2 articulation maintained with milder at myopathy. Osteopenia without destructive bony lesions. Multilevel moderate facet arthropathy. Old RIGHT clavicle fracture. SOFT TISSUES AND SPINAL CANAL: Nonacute. Severe RIGHT and moderate LEFT calcific atherosclerosis carotid bifurcations. Dense proteinaceous versus hemorrhagic 7 mm skin nodule RIGHT lower face. DISC LEVELS: No significant osseous canal stenosis. Moderate C4-5 through C6-7 neural foraminal narrowing. UPPER CHEST: Biapical emphysema. OTHER: None. IMPRESSION: CT HEAD: 1. No acute intracranial process. 2. Stable moderate to severe atrophy. 3. 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 CT CERVICAL SPINE: 1. No acute fracture or malalignment. 2. Advanced atherosclerosis carotid bifurcations may result in hemodynamically significant stenosis. Consider carotid ultrasound versus CT angiogram of the neck on a nonemergent basis. Electronically Signed   By: Awilda Metro M.D.   On: 10/06/2016 04:54   Dg Shoulder Left  Result Date: 10/06/2016 CLINICAL DATA:  Ecchymosis and deformity after falling on stairs  tonight EXAM: LEFT SHOULDER - 2+ VIEW COMPARISON:  None. FINDINGS: There is a proximal left humeral fracture with marked anterior displacement and mild comminution. The humeral head fragment is rotated but probably remains in contact with the glenoid. Multiple old left rib fractures. Irregularity of the distal clavicle is probably due to remote healed fracture. IMPRESSION: Comminuted displaced proximal left humeral fracture. Electronically Signed   By: Ellery Plunk M.D.   On: 10/06/2016 02:56   Dg Humerus Left  Result Date: 10/06/2016 CLINICAL DATA:  Ecchymosis and deformity after falling on stairs tonight. EXAM: LEFT HUMERUS - 2+ VIEW COMPARISON:  None. FINDINGS: There is an acute proximal left humeral fracture with marked anterior displacement and mild comminution. The humeral head fragment is rotated although it does appear to still be in contact with the glenoid. No evidence of a pathologic basis for the fracture. IMPRESSION: Comminuted markedly displaced proximal left humeral fracture. Remainder of the humerus appears intact. Electronically Signed   By: Ellery Plunk M.D.   On: 10/06/2016 02:56    ____________________________________________   PROCEDURES  Procedure(s) performed: None  Procedures  Critical Care performed: No  ____________________________________________   INITIAL IMPRESSION / ASSESSMENT AND PLAN / ED COURSE  Pertinent labs & imaging results that were available during my care of the patient were reviewed by me and considered in my medical decision making (see chart  for details).  This is a 66 year old male who comes into the hospital today after a fall. The patient has some deformity and some swelling to his left upper extremity. We sent the patient for an x-ray of his shoulder and his humerus and it was found that the patient has a proximal severely displaced humerus fracture. I then sent the patient for a CT of his head and cervical spine and check some blood  work. When the patient arrived he was mildly confused and could not give a full history as to what occurred. The patient was also intoxicated so I wanted to ensure that he did not have a head injury along with his arm injury. The patient did receive a dose of morphine for his pain. Doing the blood work it was found the patient has a hemoglobin of 7.8 and hematocrit 24.1. The patient's last hemoglobin on August 16 was 9.0. I feel that this hemoglobin is due to the patient's acute trauma. The CT of his head and cervical spine are unremarkable. I gave the patient a second liter of normal saline and he will be admitted to the hospitalist service for further evaluation and pain control. I contacted Dr. Odis LusterBowers who was on for orthopedic surgery and he reports that at this point the management of the proximal humerus fracture is immobilization. We'll place the patient in a shoulder immobilizer and he will be admitted.      ____________________________________________   FINAL CLINICAL IMPRESSION(S) / ED DIAGNOSES  Final diagnoses:  Fall, initial encounter  Other closed displaced fracture of proximal end of left humerus, initial encounter  Acute blood loss anemia  Hypokalemia  Hyponatremia      NEW MEDICATIONS STARTED DURING THIS VISIT:  New Prescriptions   No medications on file     Note:  This document was prepared using Dragon voice recognition software and may include unintentional dictation errors.    Rebecka ApleyWebster, Allison P, MD 10/06/16 [phone]    Rebecka ApleyWebster, Allison P, MD 10/22/16 [phone]

## 2016-10-06 NOTE — Progress Notes (Signed)
[facility] - [place] at [facility]   PATIENT NAME: Richard Graves    MR#:  [id number]  DATE OF BIRTH:  11/20/1950  SUBJECTIVE:  CHIEF COMPLAINT:   Chief Complaint  Patient presents with  . Fall  . Arm Injury    Left     Came after a fall by missing a step on porch, homeless, drinks daily, uses coccaine sometimes ( last use was more than a week ago). Have broken left humerus. C/o pain. Low potassium, no signs of withdrawal yet.  REVIEW OF SYSTEMS:  CONSTITUTIONAL: No fever, fatigue or weakness.  EYES: No blurred or double vision.  EARS, NOSE, AND THROAT: No tinnitus or ear pain.  RESPIRATORY: No cough, shortness of breath, wheezing or hemoptysis.  CARDIOVASCULAR: No chest pain, orthopnea, edema.  GASTROINTESTINAL: No nausea, vomiting, diarrhea or abdominal pain.  GENITOURINARY: No dysuria, hematuria.  ENDOCRINE: No polyuria, nocturia,  HEMATOLOGY: No anemia, easy bruising or bleeding SKIN: No rash or lesion. MUSCULOSKELETAL: No joint pain or arthritis.   NEUROLOGIC: No tingling, numbness, weakness.  PSYCHIATRY: No anxiety or depression.   ROS  DRUG ALLERGIES:  Not on File  VITALS:  Blood pressure 106/65, pulse 73, temperature 98 F (36.7 C), temperature source Oral, resp. rate 18, height 5\' 7"  (1.702 m), weight 56.7 kg (125 lb), SpO2 96 %.  PHYSICAL EXAMINATION:  GENERAL:  66 y.o.-year-old patient lying in the bed with no acute distress.  EYES: Pupils equal, round, reactive to light and accommodation. No scleral icterus. Extraocular muscles intact.  HEENT: Head atraumatic, normocephalic. Oropharynx and nasopharynx clear.  NECK:  Supple, no jugular venous distention. No thyroid enlargement, no tenderness.  LUNGS: Normal breath sounds bilaterally, no wheezing, rales,rhonchi or crepitation. No use of accessory muscles of respiration.  CARDIOVASCULAR: S1, S2 normal. No murmurs, rubs, or gallops.  ABDOMEN: Soft, nontender, nondistended. Bowel sounds present.  No organomegaly or mass.  EXTREMITIES: No pedal edema, cyanosis, or clubbing.  NEUROLOGIC: Cranial nerves II through XII are intact. Muscle strength 5/5 in all extremities. Except left Upper limb- not checked due to being in sling. Sensation intact. Gait not checked. No tremers. PSYCHIATRIC: The patient is alert and oriented x 3.  SKIN: No obvious rash, lesion, or ulcer.   Physical Exam LABORATORY PANEL:   CBC  Recent Labs Lab 10/06/16 0158  WBC 6.4  HGB 7.8*  HCT 24.1*  PLT 244   ------------------------------------------------------------------------------------------------------------------  Chemistries   Recent Labs Lab 10/06/16 0158  NA 130*  K 2.8*  CL 94*  CO2 24  GLUCOSE 99  BUN 12  CREATININE 0.55*  CALCIUM 8.5*   ------------------------------------------------------------------------------------------------------------------  Cardiac Enzymes No results for input(s): TROPONINI in the last 168 hours. ------------------------------------------------------------------------------------------------------------------  RADIOLOGY:  Ct Head Wo Contrast  Result Date: 10/06/2016 CLINICAL DATA:  Larey Seat downstairs. No loss of consciousness. LEFT arm pain. History of hypertension and alcohol abuse. EXAM: CT HEAD WITHOUT CONTRAST CT CERVICAL SPINE WITHOUT CONTRAST TECHNIQUE: Multidetector CT imaging of the head and cervical spine was performed following the standard protocol without intravenous contrast. Multiplanar CT image reconstructions of the cervical spine were also generated. COMPARISON:  CT HEAD September 21, 2016 FINDINGS: CT HEAD FINDINGS BRAIN: No intraparenchymal hemorrhage, mass effect nor midline shift. Moderate to severe parenchymal brain volume loss. No hydrocephalus. Old bilateral basal ganglia infarcts with ex vacuo dilatation LEFT frontal horn of the lateral ventricle. Patchy supratentorial white matter hypodensities unchanged. No abnormal extra-axial fluid  collections. VASCULAR: Moderate to severe calcific atherosclerosis of the carotid siphons.  SKULL: No skull fracture. Osteopenia. No significant scalp soft tissue swelling. SINUSES/ORBITS: Partially imaged chronic RIGHT maxillary sinusitis with bony remodeling. Small LEFT mastoid effusion. The included ocular globes and orbital contents are non-suspicious. OTHER: None. CT CERVICAL SPINE FINDINGS ALIGNMENT: Maintained lordosis. Vertebral bodies in alignment. SKULL BASE AND VERTEBRAE: Cervical vertebral bodies and posterior elements are intact. Severe C5-6 and C6-7 disc height loss with endplate spurring compatible with degenerative discs, moderate C3-4 and C4-5. C1-2 articulation maintained with milder at myopathy. Osteopenia without destructive bony lesions. Multilevel moderate facet arthropathy. Old RIGHT clavicle fracture. SOFT TISSUES AND SPINAL CANAL: Nonacute. Severe RIGHT and moderate LEFT calcific atherosclerosis carotid bifurcations. Dense proteinaceous versus hemorrhagic 7 mm skin nodule RIGHT lower face. DISC LEVELS: No significant osseous canal stenosis. Moderate C4-5 through C6-7 neural foraminal narrowing. UPPER CHEST: Biapical emphysema. OTHER: None. IMPRESSION: CT HEAD: 1. No acute intracranial process. 2. Stable moderate to severe atrophy. 3. 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 CT CERVICAL SPINE: 1. No acute fracture or malalignment. 2. Advanced atherosclerosis carotid bifurcations may result in hemodynamically significant stenosis. Consider carotid ultrasound versus CT angiogram of the neck on a nonemergent basis. Electronically Signed   By: Awilda Metro M.D.   On: 10/06/2016 04:54   Ct Cervical Spine Wo Contrast  Result Date: 10/06/2016 CLINICAL DATA:  Larey Seat downstairs. No loss of consciousness. LEFT arm pain. History of hypertension and alcohol abuse. EXAM: CT HEAD WITHOUT CONTRAST CT CERVICAL SPINE WITHOUT CONTRAST TECHNIQUE: Multidetector CT  imaging of the head and cervical spine was performed following the standard protocol without intravenous contrast. Multiplanar CT image reconstructions of the cervical spine were also generated. COMPARISON:  CT HEAD September 21, 2016 FINDINGS: CT HEAD FINDINGS BRAIN: No intraparenchymal hemorrhage, mass effect nor midline shift. Moderate to severe parenchymal brain volume loss. No hydrocephalus. Old bilateral basal ganglia infarcts with ex vacuo dilatation LEFT frontal horn of the lateral ventricle. Patchy supratentorial white matter hypodensities unchanged. No abnormal extra-axial fluid collections. VASCULAR: Moderate to severe calcific atherosclerosis of the carotid siphons. SKULL: No skull fracture. Osteopenia. No significant scalp soft tissue swelling. SINUSES/ORBITS: Partially imaged chronic RIGHT maxillary sinusitis with bony remodeling. Small LEFT mastoid effusion. The included ocular globes and orbital contents are non-suspicious. OTHER: None. CT CERVICAL SPINE FINDINGS ALIGNMENT: Maintained lordosis. Vertebral bodies in alignment. SKULL BASE AND VERTEBRAE: Cervical vertebral bodies and posterior elements are intact. Severe C5-6 and C6-7 disc height loss with endplate spurring compatible with degenerative discs, moderate C3-4 and C4-5. C1-2 articulation maintained with milder at myopathy. Osteopenia without destructive bony lesions. Multilevel moderate facet arthropathy. Old RIGHT clavicle fracture. SOFT TISSUES AND SPINAL CANAL: Nonacute. Severe RIGHT and moderate LEFT calcific atherosclerosis carotid bifurcations. Dense proteinaceous versus hemorrhagic 7 mm skin nodule RIGHT lower face. DISC LEVELS: No significant osseous canal stenosis. Moderate C4-5 through C6-7 neural foraminal narrowing. UPPER CHEST: Biapical emphysema. OTHER: None. IMPRESSION: CT HEAD: 1. No acute intracranial process. 2. Stable moderate to severe atrophy. 3. 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  CT CERVICAL SPINE: 1. No acute fracture or malalignment. 2. Advanced atherosclerosis carotid bifurcations may result in hemodynamically significant stenosis. Consider carotid ultrasound versus CT angiogram of the neck on a nonemergent basis. Electronically Signed   By: Awilda Metro M.D.   On: 10/06/2016 04:54   Dg Shoulder Left  Result Date: 10/06/2016 CLINICAL DATA:  Ecchymosis and deformity after falling on stairs tonight EXAM: LEFT SHOULDER - 2+ VIEW COMPARISON:  None. FINDINGS: There  is a proximal left humeral fracture with marked anterior displacement and mild comminution. The humeral head fragment is rotated but probably remains in contact with the glenoid. Multiple old left rib fractures. Irregularity of the distal clavicle is probably due to remote healed fracture. IMPRESSION: Comminuted displaced proximal left humeral fracture. Electronically Signed   By: Ellery Plunkaniel R Mitchell M.D.   On: 10/06/2016 02:56   Dg Humerus Left  Result Date: 10/06/2016 CLINICAL DATA:  Ecchymosis and deformity after falling on stairs tonight. EXAM: LEFT HUMERUS - 2+ VIEW COMPARISON:  None. FINDINGS: There is an acute proximal left humeral fracture with marked anterior displacement and mild comminution. The humeral head fragment is rotated although it does appear to still be in contact with the glenoid. No evidence of a pathologic basis for the fracture. IMPRESSION: Comminuted markedly displaced proximal left humeral fracture. Remainder of the humerus appears intact. Electronically Signed   By: Ellery Plunkaniel R Mitchell M.D.   On: 10/06/2016 02:56    ASSESSMENT AND PLAN:   Active Problems:   Hypokalemia   * Chronic alcoholism   High risk for withdrawal, on CIWA protocol, but currently no signs of withdrawal.  * Hypokalemia   Replaced oral and receiving IV    Check Mg, and recheck K now.    * hepatitis   This is mentioned in past history, but we don't have previous records and he does not take any meds at home.    I will check his LFTs now.  * Anemia   Likely iron deficiency, nutrotional, being alcoholic and homeless.   Check Iron studies   Follow.  * Active smoking   COuncelled to quit smoking- Nicotine patch offered.  * Coccaine use.    Pt confirms last use was more than a week ago.    No signs of withdrawal or overdose.  * Humerus fracture   Sling for now.   Ortho consult requested.    I requested to check repeat potassium and LFTs for now.   He has no health maintenance visits or old records.    Being a smoker, and alcoholic and home less, he may have any underlying heart issues- untested.   But currently he denies any of cardiac symptoms.   If ortho decide to do surgery, we  May proceed with considering moderate risk due to underlying issues- AFTER checking his potassium and LFTs- without any further work ups, if he is still not in alcohol withdrawal.    All the records are reviewed and case discussed with Care Management/Social Workerr. Management plans discussed with the patient, family and they are in agreement.  CODE STATUS: Full.  TOTAL TIME TAKING CARE OF THIS PATIENT: 35 minutes.     POSSIBLE D/C IN 1-2 DAYS, DEPENDING ON CLINICAL CONDITION.   Altamese DillingVACHHANI, Vanisha Whiten M.D on 10/06/2016   Between 7am to 6pm - Pager - [phone]  After 6pm go to [email or web address] - password [email or web address]  [facility]  Office  [phone]  CC: Primary care physician; Patient, No Pcp Per  Note: This dictation was prepared with Dragon dictation along with smaller phrase technology. Any transcriptional errors that result from this process are unintentional.

## 2016-10-06 NOTE — ED Notes (Signed)
Patient transported to X-ray 

## 2016-10-06 NOTE — NC FL2 (Signed)
[place] MEDICAID FL2 LEVEL OF CARE SCREENING TOOL     IDENTIFICATION  Patient Name: Richard Graves Birthdate: January 19, 1951 Sex: male Admission Date (Current Location): 10/06/2016  [place] and [place] Number:  Chiropodist and Address:  [facility], [address] [place], [place] [address]      Provider Number: [id number]  Attending Physician Name and Address:  Altamese Dilling, *  Relative Name and Phone Number:  none    Current Level of Care: Hospital Recommended Level of Care: Skilled Nursing Facility Prior Approval Number:    Date Approved/Denied:   PASRR Number: Pending  Discharge Plan: SNF    Current Diagnoses: Patient Active Problem List   Diagnosis Date Noted  . Hypokalemia 10/06/2016    Orientation RESPIRATION BLADDER Height & Weight     Self, Time, Situation, Place  Normal Continent Weight: 125 lb (56.7 kg) Height:  5\' 7"  (170.2 cm)  BEHAVIORAL SYMPTOMS/MOOD NEUROLOGICAL BOWEL NUTRITION STATUS      Continent Diet (Regular diet)  AMBULATORY STATUS COMMUNICATION OF NEEDS Skin   Limited Assist Verbally Normal                       Personal Care Assistance Level of Assistance  Bathing, Feeding, Dressing Bathing Assistance: Limited assistance Feeding assistance: Independent Dressing Assistance: Limited assistance     Functional Limitations Info  Sight, Hearing, Speech Sight Info: Adequate Hearing Info: Adequate Speech Info: Adequate    SPECIAL CARE FACTORS FREQUENCY  PT (By licensed PT), OT (By licensed OT)     PT Frequency: 5x a week OT Frequency: 5x a week            Contractures Contractures Info: Not present    Additional Factors Info  Allergies, Code Status Code Status Info: Full Code Allergies Info: NKA           Current Medications (10/06/2016):  This is the current hospital active medication list Current Facility-Administered Medications  Medication Dose Route Frequency Provider  Last Rate Last Dose  . 0.9 % NaCl with KCl 20 mEq/ L  infusion   Intravenous Continuous Ihor Austin, MD 75 mL/hr at 10/06/16 0636    . acetaminophen (TYLENOL) tablet 650 mg  650 mg Oral Q6H PRN Ihor Austin, MD       Or  . acetaminophen (TYLENOL) suppository 650 mg  650 mg Rectal Q6H PRN Pyreddy, Pavan, MD      . ferrous sulfate tablet 325 mg  325 mg Oral BID WC Altamese Dilling, MD   325 mg at 10/06/16 1615  . folic acid (FOLVITE) tablet 1 mg  1 mg Oral Daily Pyreddy, Vivien Rota, MD   1 mg at 10/06/16 0814  . HYDROcodone-acetaminophen (NORCO/VICODIN) 5-325 MG per tablet 1-2 tablet  1-2 tablet Oral Q4H PRN Ihor Austin, MD   2 tablet at 10/06/16 1441  . LORazepam (ATIVAN) tablet 1 mg  1 mg Oral Q6H PRN Ihor Austin, MD       Or  . LORazepam (ATIVAN) injection 1 mg  1 mg Intravenous Q6H PRN Pyreddy, Pavan, MD      . LORazepam (ATIVAN) tablet 0-4 mg  0-4 mg Oral Q6H Pyreddy, Vivien Rota, MD       Followed by  . [START ON 10/08/2016] LORazepam (ATIVAN) tablet 0-4 mg  0-4 mg Oral Q12H Pyreddy, Pavan, MD      . morphine 2 MG/ML injection 2 mg  2 mg Intravenous Q4H PRN Ihor Austin, MD      .  multivitamin with minerals tablet 1 tablet  1 tablet Oral Daily Pyreddy, Pavan, MD   1 tablet at 10/06/16 0814  . nicotine (NICODERM CQ - dosed in mg/24 hours) patch 21 mg  21 mg Transdermal Daily Altamese DillingVachhani, Lindwood Mogel, MD   21 mg at 10/06/16 1027  . ondansetron (ZOFRAN) tablet 4 mg  4 mg Oral Q6H PRN Pyreddy, Vivien RotaPavan, MD       Or  . ondansetron (ZOFRAN) injection 4 mg  4 mg Intravenous Q6H PRN Pyreddy, Pavan, MD      . senna-docusate (Senokot-S) tablet 1 tablet  1 tablet Oral QHS PRN Pyreddy, Vivien RotaPavan, MD      . thiamine (VITAMIN B-1) tablet 100 mg  100 mg Oral Daily Pyreddy, Pavan, MD   100 mg at 10/06/16 [id number]   Or  . thiamine (B-1) injection 100 mg  100 mg Intravenous Daily Ihor AustinPyreddy, Pavan, MD         Discharge Medications: Please see discharge summary for a list of discharge medications.  Relevant  Imaging Results:  Relevant Lab Results:   Additional Information SSN [id number]  Darleene Cleavernterhaus, Eric R, [place]

## 2016-10-06 NOTE — Progress Notes (Signed)
Followed lab results, satisfactory Spoke to Ortho, no plans for surgery. Diet ordered.

## 2016-10-07 LAB — BASIC METABOLIC PANEL
ANION GAP: 5 (ref 5–15)
BUN: 8 mg/dL (ref 6–20)
CHLORIDE: 102 mmol/L (ref 101–111)
CO2: 25 mmol/L (ref 22–32)
Calcium: 8 mg/dL — ABNORMAL LOW (ref 8.9–10.3)
Creatinine, Ser: 0.44 mg/dL — ABNORMAL LOW (ref 0.61–1.24)
GFR calc Af Amer: >60 mL/min (ref >60)
Glucose, Bld: 103 mg/dL — ABNORMAL HIGH (ref 65–99)
Potassium: 3.4 mmol/L — ABNORMAL LOW (ref 3.5–5.1)
SODIUM: 132 mmol/L — AB (ref 135–145)

## 2016-10-07 LAB — CBC
HCT: 22.6 % — ABNORMAL LOW (ref 40.0–52.0)
Hemoglobin: 7 g/dL — ABNORMAL LOW (ref 13.0–18.0)
MCH: 25.5 pg — ABNORMAL LOW (ref 26.0–34.0)
MCHC: 31.2 g/dL — AB (ref 32.0–36.0)
MCV: 81.9 fL (ref 80.0–100.0)
PLATELETS: 261 10*3/uL (ref 150–440)
RBC: 2.76 MIL/uL — ABNORMAL LOW (ref 4.40–5.90)
RDW: 19.7 % — AB (ref 11.5–14.5)
WBC: 4.8 10*3/uL (ref 3.8–10.6)

## 2016-10-07 MED ORDER — IRON DEXTRAN 50 MG/ML IJ SOLN: 100.0000 mg | Freq: Once | INTRAMUSCULAR | Status: DC

## 2016-10-07 MED ORDER — SODIUM CHLORIDE 0.9 % IV SOLN
510.0000 mg | Freq: Once | INTRAVENOUS | Status: AC
  Administered 2016-10-07: 510 mg via INTRAVENOUS
  Filled 2016-10-07: qty 17

## 2016-10-07 MED ORDER — SODIUM CHLORIDE 0.9 % IV SOLN
500.0000 mg | Freq: Once | INTRAVENOUS | Status: DC
  Filled 2016-10-07: qty 25

## 2016-10-07 MED ORDER — POTASSIUM CHLORIDE CRYS ER 20 MEQ PO TBCR
40.0000 meq | EXTENDED_RELEASE_TABLET | Freq: Two times a day (BID) | ORAL | Status: AC
  Administered 2016-10-07 (×2): 40 meq via ORAL
  Filled 2016-10-07 (×2): qty 2

## 2016-10-07 NOTE — Progress Notes (Signed)
[facility] - [place] at [facility]   PATIENT NAME: Richard LoosenRonnie Graves    MR#:  [id number]  DATE OF BIRTH:  06/09/1950  SUBJECTIVE:  CHIEF COMPLAINT:   Chief Complaint  Patient presents with  . Fall  . Arm Injury    Left     Came after a fall by missing a step on porch, homeless, drinks daily, uses coccaine sometimes ( last use was more than a week ago). He also had fall a few weeks ago. Have broken left humerus. C/o pain. Low potassium, no signs of withdrawal yet. Tolerating diet well.  REVIEW OF SYSTEMS:  CONSTITUTIONAL: No fever, fatigue or weakness.  EYES: No blurred or double vision.  EARS, NOSE, AND THROAT: No tinnitus or ear pain.  RESPIRATORY: No cough, shortness of breath, wheezing or hemoptysis.  CARDIOVASCULAR: No chest pain, orthopnea, edema.  GASTROINTESTINAL: No nausea, vomiting, diarrhea or abdominal pain.  GENITOURINARY: No dysuria, hematuria.  ENDOCRINE: No polyuria, nocturia,  HEMATOLOGY: No anemia, easy bruising or bleeding SKIN: No rash or lesion. MUSCULOSKELETAL: No joint pain or arthritis.   NEUROLOGIC: No tingling, numbness, weakness.  PSYCHIATRY: No anxiety or depression.   ROS  DRUG ALLERGIES:  Not on File  VITALS:  Blood pressure 131/82, pulse 74, temperature 98 F (36.7 C), temperature source Oral, resp. rate 18, height 5\' 7"  (1.702 m), weight 56.7 kg (125 lb), SpO2 97 %.  PHYSICAL EXAMINATION:  GENERAL:  66 y.o.-year-old patient lying in the bed with no acute distress.  EYES: Pupils equal, round, reactive to light and accommodation. No scleral icterus. Extraocular muscles intact.  HEENT: Head atraumatic, normocephalic. Oropharynx and nasopharynx clear.  NECK:  Supple, no jugular venous distention. No thyroid enlargement, no tenderness.  LUNGS: Normal breath sounds bilaterally, no wheezing, rales,rhonchi or crepitation. No use of accessory muscles of respiration.  CARDIOVASCULAR: S1, S2 normal. No murmurs, rubs, or gallops.   ABDOMEN: Soft, nontender, nondistended. Bowel sounds present. No organomegaly or mass.  EXTREMITIES: No pedal edema, cyanosis, or clubbing.  NEUROLOGIC: Cranial nerves II through XII are intact. Muscle strength 5/5 in all extremities. Except left Upper limb- not checked due to being in sling. Sensation intact. Gait not checked. No tremers. PSYCHIATRIC: The patient is alert and oriented x 3.  SKIN: No obvious rash, lesion, or ulcer.   Physical Exam LABORATORY PANEL:   CBC  Recent Labs Lab 10/07/16 0443  WBC 4.8  HGB 7.0*  HCT 22.6*  PLT 261   ------------------------------------------------------------------------------------------------------------------  Chemistries   Recent Labs Lab 10/06/16 1100 10/07/16 0443  NA  --  132*  K 3.5 3.4*  CL  --  102  CO2  --  25  GLUCOSE  --  103*  BUN  --  8  CREATININE  --  0.44*  CALCIUM  --  8.0*  MG 1.8  --   AST 44*  --   ALT 32  --   ALKPHOS 35*  --   BILITOT 1.1  --    ------------------------------------------------------------------------------------------------------------------  Cardiac Enzymes No results for input(s): TROPONINI in the last 168 hours. ------------------------------------------------------------------------------------------------------------------  RADIOLOGY:  Ct Head Wo Contrast  Result Date: 10/06/2016 CLINICAL DATA:  Larey SeatFell downstairs. No loss of consciousness. LEFT arm pain. History of hypertension and alcohol abuse. EXAM: CT HEAD WITHOUT CONTRAST CT CERVICAL SPINE WITHOUT CONTRAST TECHNIQUE: Multidetector CT imaging of the head and cervical spine was performed following the standard protocol without intravenous contrast. Multiplanar CT image reconstructions of the cervical spine were also generated. COMPARISON:  CT HEAD September 21, 2016 FINDINGS: CT HEAD FINDINGS BRAIN: No intraparenchymal hemorrhage, mass effect nor midline shift. Moderate to severe parenchymal brain volume loss. No hydrocephalus.  Old bilateral basal ganglia infarcts with ex vacuo dilatation LEFT frontal horn of the lateral ventricle. Patchy supratentorial white matter hypodensities unchanged. No abnormal extra-axial fluid collections. VASCULAR: Moderate to severe calcific atherosclerosis of the carotid siphons. SKULL: No skull fracture. Osteopenia. No significant scalp soft tissue swelling. SINUSES/ORBITS: Partially imaged chronic RIGHT maxillary sinusitis with bony remodeling. Small LEFT mastoid effusion. The included ocular globes and orbital contents are non-suspicious. OTHER: None. CT CERVICAL SPINE FINDINGS ALIGNMENT: Maintained lordosis. Vertebral bodies in alignment. SKULL BASE AND VERTEBRAE: Cervical vertebral bodies and posterior elements are intact. Severe C5-6 and C6-7 disc height loss with endplate spurring compatible with degenerative discs, moderate C3-4 and C4-5. C1-2 articulation maintained with milder at myopathy. Osteopenia without destructive bony lesions. Multilevel moderate facet arthropathy. Old RIGHT clavicle fracture. SOFT TISSUES AND SPINAL CANAL: Nonacute. Severe RIGHT and moderate LEFT calcific atherosclerosis carotid bifurcations. Dense proteinaceous versus hemorrhagic 7 mm skin nodule RIGHT lower face. DISC LEVELS: No significant osseous canal stenosis. Moderate C4-5 through C6-7 neural foraminal narrowing. UPPER CHEST: Biapical emphysema. OTHER: None. IMPRESSION: CT HEAD: 1. No acute intracranial process. 2. Stable moderate to severe atrophy. 3. 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 CT CERVICAL SPINE: 1. No acute fracture or malalignment. 2. Advanced atherosclerosis carotid bifurcations may result in hemodynamically significant stenosis. Consider carotid ultrasound versus CT angiogram of the neck on a nonemergent basis. Electronically Signed   By: Awilda Metro M.D.   On: 10/06/2016 04:54   Ct Cervical Spine Wo Contrast  Result Date: 10/06/2016 CLINICAL DATA:   Larey Seat downstairs. No loss of consciousness. LEFT arm pain. History of hypertension and alcohol abuse. EXAM: CT HEAD WITHOUT CONTRAST CT CERVICAL SPINE WITHOUT CONTRAST TECHNIQUE: Multidetector CT imaging of the head and cervical spine was performed following the standard protocol without intravenous contrast. Multiplanar CT image reconstructions of the cervical spine were also generated. COMPARISON:  CT HEAD September 21, 2016 FINDINGS: CT HEAD FINDINGS BRAIN: No intraparenchymal hemorrhage, mass effect nor midline shift. Moderate to severe parenchymal brain volume loss. No hydrocephalus. Old bilateral basal ganglia infarcts with ex vacuo dilatation LEFT frontal horn of the lateral ventricle. Patchy supratentorial white matter hypodensities unchanged. No abnormal extra-axial fluid collections. VASCULAR: Moderate to severe calcific atherosclerosis of the carotid siphons. SKULL: No skull fracture. Osteopenia. No significant scalp soft tissue swelling. SINUSES/ORBITS: Partially imaged chronic RIGHT maxillary sinusitis with bony remodeling. Small LEFT mastoid effusion. The included ocular globes and orbital contents are non-suspicious. OTHER: None. CT CERVICAL SPINE FINDINGS ALIGNMENT: Maintained lordosis. Vertebral bodies in alignment. SKULL BASE AND VERTEBRAE: Cervical vertebral bodies and posterior elements are intact. Severe C5-6 and C6-7 disc height loss with endplate spurring compatible with degenerative discs, moderate C3-4 and C4-5. C1-2 articulation maintained with milder at myopathy. Osteopenia without destructive bony lesions. Multilevel moderate facet arthropathy. Old RIGHT clavicle fracture. SOFT TISSUES AND SPINAL CANAL: Nonacute. Severe RIGHT and moderate LEFT calcific atherosclerosis carotid bifurcations. Dense proteinaceous versus hemorrhagic 7 mm skin nodule RIGHT lower face. DISC LEVELS: No significant osseous canal stenosis. Moderate C4-5 through C6-7 neural foraminal narrowing. UPPER CHEST: Biapical  emphysema. OTHER: None. IMPRESSION: CT HEAD: 1. No acute intracranial process. 2. Stable moderate to severe atrophy. 3. 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 CT CERVICAL SPINE: 1. No acute fracture or malalignment. 2. Advanced atherosclerosis carotid  bifurcations may result in hemodynamically significant stenosis. Consider carotid ultrasound versus CT angiogram of the neck on a nonemergent basis. Electronically Signed   By: Awilda Metro M.D.   On: 10/06/2016 04:54   Dg Shoulder Left  Result Date: 10/06/2016 CLINICAL DATA:  Ecchymosis and deformity after falling on stairs tonight EXAM: LEFT SHOULDER - 2+ VIEW COMPARISON:  None. FINDINGS: There is a proximal left humeral fracture with marked anterior displacement and mild comminution. The humeral head fragment is rotated but probably remains in contact with the glenoid. Multiple old left rib fractures. Irregularity of the distal clavicle is probably due to remote healed fracture. IMPRESSION: Comminuted displaced proximal left humeral fracture. Electronically Signed   By: Ellery Plunk M.D.   On: 10/06/2016 02:56   Dg Humerus Left  Result Date: 10/06/2016 CLINICAL DATA:  Ecchymosis and deformity after falling on stairs tonight. EXAM: LEFT HUMERUS - 2+ VIEW COMPARISON:  None. FINDINGS: There is an acute proximal left humeral fracture with marked anterior displacement and mild comminution. The humeral head fragment is rotated although it does appear to still be in contact with the glenoid. No evidence of a pathologic basis for the fracture. IMPRESSION: Comminuted markedly displaced proximal left humeral fracture. Remainder of the humerus appears intact. Electronically Signed   By: Ellery Plunk M.D.   On: 10/06/2016 02:56    ASSESSMENT AND PLAN:   Active Problems:   Hypokalemia   * Chronic alcoholism   High risk for withdrawal, on CIWA protocol, but currently no signs of withdrawal.  *  Hypokalemia   Replaced oral and receiving IV    Check Mg, and recheck K came up.    * hepatitis   This is mentioned in past history, but we don't have previous records and he does not take any meds at home.   Checked LFTs, stable.  * Anemia iron deficiency, nutrotional, being alcoholic and homeless.   Checked Iron studies   Now found, he had previous visits, and hi was registered under different number- the chart is flaged to merge in computer system.   In that chart, noted his Hb was higher in past.   Will check stool for guiac, he said- he had some dark stools.   IV iron one dose today. Hb 7, monitor.  * Active smoking   COuncelled to quit smoking- Nicotine patch offered.  * Coccaine use.    Pt confirms last use was more than a week ago.    No signs of withdrawal or overdose.  * Humerus fracture   Sling for now.   Ortho consult appreciated. No surgery.     All the records are reviewed and case discussed with Care Management/Social Workerr. Management plans discussed with the patient, family and they are in agreement.  CODE STATUS: Full.  TOTAL TIME TAKING CARE OF THIS PATIENT: 35 minutes.    POSSIBLE D/C IN 1-2 DAYS, DEPENDING ON CLINICAL CONDITION.   Altamese Dilling M.D on 10/07/2016   Between 7am to 6pm - Pager - [phone]  After 6pm go to [email or web address] - password [email or web address]  [facility] [place] [facility]  Office  [phone]  CC: Primary care physician; Patient, No Pcp Per  Note: This dictation was prepared with Dragon dictation along with smaller phrase technology. Any transcriptional errors that result from this process are unintentional.

## 2016-10-07 NOTE — Progress Notes (Signed)
CONCERNING: IV to Oral Route Change Policy  RECOMMENDATION: This patient is receiving thiamine by the intravenous route.  Based on criteria approved by the Pharmacy and Therapeutics Committee, the intravenous medication(s) is/are being converted to the equivalent oral dose form(s).   DESCRIPTION: These criteria include:  The patient is eating (either orally or via tube) and/or has been taking other orally administered medications for a least 24 hours  The patient has no evidence of active gastrointestinal bleeding or impaired GI absorption (gastrectomy, short bowel, patient on TNA or NPO).  If you have questions about this conversion, please contact the Pharmacy Department  []   [phone]( [phone] )  [facility] []   [phone]( [phone] )  [facility] []   [phone]( [phone] )  [facility] []   [phone]( [phone]  [facility] []   [phone]( [phone] )  [facility]   Simpson,Michael L, [facility] CenterRPH 10/07/2016 11:44 AM

## 2016-10-08 LAB — BASIC METABOLIC PANEL
Anion gap: 6 (ref 5–15)
CHLORIDE: 101 mmol/L (ref 101–111)
CO2: 25 mmol/L (ref 22–32)
CREATININE: 0.51 mg/dL — AB (ref 0.61–1.24)
Calcium: 8.7 mg/dL — ABNORMAL LOW (ref 8.9–10.3)
GFR calc Af Amer: >60 mL/min (ref >60)
GFR calc non Af Amer: >60 mL/min (ref >60)
GLUCOSE: 100 mg/dL — AB (ref 65–99)
POTASSIUM: 4.1 mmol/L (ref 3.5–5.1)
SODIUM: 132 mmol/L — AB (ref 135–145)

## 2016-10-08 LAB — HIV ANTIBODY (ROUTINE TESTING W REFLEX): HIV Screen 4th Generation wRfx: NONREACTIVE

## 2016-10-08 LAB — CBC
HCT: 24.5 % — ABNORMAL LOW (ref 40.0–52.0)
HEMOGLOBIN: 7.8 g/dL — AB (ref 13.0–18.0)
MCH: 26.5 pg (ref 26.0–34.0)
MCHC: 31.8 g/dL — AB (ref 32.0–36.0)
MCV: 83.1 fL (ref 80.0–100.0)
Platelets: 317 10*3/uL (ref 150–440)
RBC: 2.94 MIL/uL — AB (ref 4.40–5.90)
RDW: 20.1 % — ABNORMAL HIGH (ref 11.5–14.5)
WBC: 7 10*3/uL (ref 3.8–10.6)

## 2016-10-08 NOTE — Care Management (Signed)
Patient admitted on 8/31 and code 5744 issued 9/2.  OT has recommended snf, but will not qualify for snf due to current observation status.  Obtained order for physical therapy. Is not scoring on CIWA scale

## 2016-10-08 NOTE — Progress Notes (Signed)
[facility] - [place] [facility]   PATIENT NAME: Richard LoosenRonnie Graves    MR#:  [id number]  DATE OF BIRTH:  01/11/1951  SUBJECTIVE:  CHIEF COMPLAINT:   Chief Complaint  Patient presents with  . Fall  . Arm Injury    Left     Came after a fall by missing a step on porch, homeless, drinks daily, uses coccaine sometimes ( last use was more than a week ago). He also had fall a few weeks ago. Have broken left humerus. C/o pain. Low potassium, no signs of withdrawal yet. Tolerating diet well. Generalized weakness.  REVIEW OF SYSTEMS:  CONSTITUTIONAL: No fever, fatigue or weakness.  EYES: No blurred or double vision.  EARS, NOSE, AND THROAT: No tinnitus or ear pain.  RESPIRATORY: No cough, shortness of breath, wheezing or hemoptysis.  CARDIOVASCULAR: No chest pain, orthopnea, edema.  GASTROINTESTINAL: No nausea, vomiting, diarrhea or abdominal pain.  GENITOURINARY: No dysuria, hematuria.  ENDOCRINE: No polyuria, nocturia,  HEMATOLOGY: No anemia, easy bruising or bleeding SKIN: No rash or lesion. MUSCULOSKELETAL: No joint pain or arthritis.   NEUROLOGIC: No tingling, numbness, weakness.  PSYCHIATRY: No anxiety or depression.   ROS  DRUG ALLERGIES:  Not on File  VITALS:  Blood pressure 122/78, pulse 84, temperature 98.2 F (36.8 C), temperature source Oral, resp. rate 19, height 5\' 7"  (1.702 m), weight 56.7 kg (125 lb), SpO2 100 %.  PHYSICAL EXAMINATION:  GENERAL:  66 y.o.-year-old patient lying in the bed with no acute distress.  EYES: Pupils equal, round, reactive to light and accommodation. No scleral icterus. Extraocular muscles intact.  HEENT: Head atraumatic, normocephalic. Oropharynx and nasopharynx clear.  NECK:  Supple, no jugular venous distention. No thyroid enlargement, no tenderness.  LUNGS: Normal breath sounds bilaterally, no wheezing, rales,rhonchi or crepitation. No use of accessory muscles of respiration.  CARDIOVASCULAR: S1, S2 normal. No murmurs,  rubs, or gallops.  ABDOMEN: Soft, nontender, nondistended. Bowel sounds present. No organomegaly or mass.  EXTREMITIES: No pedal edema, cyanosis, or clubbing. Left upper limb in a sling. NEUROLOGIC: Cranial nerves II through XII are intact. Muscle strength 4/5 in all extremities. Except left Upper limb- not checked due to being in sling. Sensation intact. Gait not checked. No tremers. PSYCHIATRIC: The patient is alert and oriented x 3.  SKIN: No obvious rash, lesion, or ulcer.   Physical Exam LABORATORY PANEL:   CBC  Recent Labs Lab 10/08/16 0546  WBC 7.0  HGB 7.8*  HCT 24.5*  PLT 317   ------------------------------------------------------------------------------------------------------------------  Chemistries   Recent Labs Lab 10/06/16 1100  10/08/16 0546  NA  --   < > 132*  K 3.5  < > 4.1  CL  --   < > 101  CO2  --   < > 25  GLUCOSE  --   < > 100*  BUN  --   < > <5*  CREATININE  --   < > 0.51*  CALCIUM  --   < > 8.7*  MG 1.8  --   --   AST 44*  --   --   ALT 32  --   --   ALKPHOS 35*  --   --   BILITOT 1.1  --   --   < > = values in this interval not displayed. ------------------------------------------------------------------------------------------------------------------  Cardiac Enzymes No results for input(s): TROPONINI in the last 168 hours. ------------------------------------------------------------------------------------------------------------------  RADIOLOGY:  No results found.  ASSESSMENT AND PLAN:   Active Problems:  Hypokalemia   * Chronic alcoholism   High risk for withdrawal, on CIWA protocol, but currently no signs of withdrawal.  * Hypokalemia   Replaced oral and receiving IV    Check Mg, and recheck K came up.    * hepatitis   This is mentioned in past history, but we don't have previous records and he does not take any meds at home.   Checked LFTs, stable.  * Anemia iron deficiency, nutrotional, being alcoholic and  homeless.   Checked Iron studies   Now found, he had previous visits, and hi was registered under different number- the chart is flaged to merge in computer system.   In that chart, noted his Hb was higher in past.   Will check stool for guiac, he said- he had some dark stools. Still waiting to collect sample.   IV iron one dose today. Hb 7, monitor. Stable.  * Active smoking   Councelled to quit smoking- Nicotine patch offered.  * Coccaine use.    Pt confirms last use was more than a week ago.    No signs of withdrawal or overdose.  * Humerus fracture   Sling for now.   Ortho consult appreciated. No surgery.     All the records are reviewed and case discussed with Care Management/Social Workerr. Management plans discussed with the patient, family and they are in agreement.  CODE STATUS: Full.  TOTAL TIME TAKING CARE OF THIS PATIENT: 35 minutes.   Need SNF per OT eval. Wait for placement.  POSSIBLE D/C IN 1-2 DAYS, DEPENDING ON CLINICAL CONDITION.   Altamese Dilling M.D on 10/08/2016   Between 7am to 6pm - Pager - [phone]  After 6pm go to [email or web address] - password [email or web address]  [facility] [place] [facility]  Office  [phone]  CC: Primary care physician; Patient, No Pcp Per  Note: This dictation was prepared with Dragon dictation along with smaller phrase technology. Any transcriptional errors that result from this process are unintentional.

## 2016-10-08 NOTE — Progress Notes (Signed)
Occupational Therapy Treatment Patient Details Name: Richard LoosenRonnie Graves MRN: [id number] DOB: 04/01/1950 Today's Date: 10/08/2016    History of present illness Pt. is a 66 y.o. 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 Pt. PMHx includes: Alcohol Abuse, Hepatitis, and HTN.   OT comments  Pt seen for brief treatment session this date, limited by 10/10 pain in L shoulder. Pt participated in LUE gentle PROM exercises (please see detail below) and pt requested to stop due to pain. Pt premedicated prior to session, alert and oriented, but reporting too much pain despite previous medications given to continue with ROM exercises. Continue to progress towards goals, including education/training in 1 handed dressing techniques using RUE and P/AROM exercises for LUE. STR recommendation remains appropriate, will continue to assess for discharge needs.   Follow Up Recommendations  SNF    Equipment Recommendations  Other (comment) Lexicographer(reacher)    Recommendations for Other Services      Precautions / Restrictions Precautions Required Braces or Orthoses: Other Brace/Splint (LUE immobilizer, may start to wean over next couple days per ortho) Restrictions Weight Bearing Restrictions: Yes LUE Weight Bearing: Non weight bearing       Mobility Bed Mobility               General bed mobility comments: pt declined bed mobility due to LUE pain  Transfers                 General transfer comment: pt declined OOB due to LUE pain    Balance                                           ADL either performed or assessed with clinical judgement   ADL Overall ADL's : Needs assistance/impaired Eating/Feeding: Set up;Minimal assistance   Grooming: Set up;Minimal assistance   Upper Body Bathing: Maximal assistance;Moderate assistance   Lower Body Bathing: Maximal assistance;Moderate assistance   Upper Body Dressing :  Maximal assistance;Moderate assistance   Lower Body Dressing: Maximal assistance;Moderate assistance                 General ADL Comments: mod-max assist for UB and LB ADL tasks.      Vision Baseline Vision/History: No visual deficits Patient Visual Report: No change from baseline     Perception     Praxis      Cognition Arousal/Alertness: Awake/alert Behavior During Therapy: WFL for tasks assessed/performed Overall Cognitive Status: Within Functional Limits for tasks assessed                                          Exercises Other Exercises Other Exercises: Gentle PROM exercises performed for L hand, wrist flex/ext, ulnar/radial deviation, forearm pronation/supination, elbow ext/flex, and gentle/limited PROM of shoulder flexion, internal/ext rotation, and abd/adduction. Pt performed AROM of shoulder elevation while adjusting for comfort.   Shoulder Instructions       General Comments      Pertinent Vitals/ Pain       Pain Assessment: 0-10 Pain Score: 10-Worst pain ever Pain Location: Left UE Pain Descriptors / Indicators: Aching;Sharp Pain Intervention(s): Limited activity within patient's tolerance;Monitored during session;Premedicated before session;Repositioned  Home Living  Prior Functioning/Environment              Frequency  Min 2X/week        Progress Toward Goals  OT Goals(current goals can now be found in the care plan section)  Progress towards OT goals: Progressing toward goals     Plan Discharge plan remains appropriate;Frequency remains appropriate    Co-evaluation                 AM-PAC PT "6 Clicks" Daily Activity     Outcome Measure   Help from another person eating meals?: A Little Help from another person taking care of personal grooming?: A Little Help from another person toileting, which includes using toliet, bedpan, or urinal?: A  Lot Help from another person bathing (including washing, rinsing, drying)?: A Lot Help from another person to put on and taking off regular upper body clothing?: A Lot Help from another person to put on and taking off regular lower body clothing?: A Lot 6 Click Score: 14    End of Session    OT Visit Diagnosis: History of falling (Z91.81)   Activity Tolerance Patient limited by pain   Patient Left in bed;with call bell/phone within reach;with bed alarm set;Other (comment) (shoulder immobilizer sling in place)   Nurse Communication      Functional Assessment Tool Used: AM-PAC 6 Clicks Daily Activity;Clinical judgement Functional Limitation: Self care Self Care Current Status (W0981): At least 40 percent but less than 60 percent impaired, limited or restricted Self Care Goal Status (X9147): At least 1 percent but less than 20 percent impaired, limited or restricted   Time: 1156-1205 OT Time Calculation (min): 9 min  Charges: OT G-codes **NOT FOR INPATIENT CLASS** Functional Assessment Tool Used: AM-PAC 6 Clicks Daily Activity;Clinical judgement Functional Limitation: Self care Self Care Current Status (W2956): At least 40 percent but less than 60 percent impaired, limited or restricted Self Care Goal Status (O1308): At least 1 percent but less than 20 percent impaired, limited or restricted OT General Charges $OT Visit: 1 Visit OT Treatments $Therapeutic Exercise: 8-22 mins  [name], MPH, MS, OTR/L ascom [phone] 10/08/16, 12:29 PM

## 2016-10-08 NOTE — Care Management Obs Status (Signed)
MEDICARE OBSERVATION STATUS NOTIFICATION   Patient Details  Name: Richard Graves MRN: [id number] Date of Birth: 03/13/1950   Medicare Observation Status Notification Given:  Yes    Elleanna Melling A, RN 10/08/2016, 4:27 PM

## 2016-10-08 NOTE — Care Management CC44 (Signed)
Condition Code 44 Documentation Completed  Patient Details  Name: Richard LoosenRonnie Graves MRN: [id number] Date of Birth: 05/16/1950   Condition Code 44 given:  Yes Patient signature on Condition Code 44 notice:  Yes Documentation of 2 MD's agreement:  Yes Code 44 added to claim:  Yes    Fronie Holstein A, RN 10/08/2016, 4:27 PM

## 2016-10-09 LAB — CBC
HCT: 25.2 % — ABNORMAL LOW (ref 40.0–52.0)
Hemoglobin: 8 g/dL — ABNORMAL LOW (ref 13.0–18.0)
MCH: 26.2 pg (ref 26.0–34.0)
MCHC: 31.8 g/dL — ABNORMAL LOW (ref 32.0–36.0)
MCV: 82.1 fL (ref 80.0–100.0)
PLATELETS: 397 10*3/uL (ref 150–440)
RBC: 3.07 MIL/uL — AB (ref 4.40–5.90)
RDW: 20.2 % — ABNORMAL HIGH (ref 11.5–14.5)
WBC: 6.8 10*3/uL (ref 3.8–10.6)

## 2016-10-09 MED ORDER — ACETAMINOPHEN 325 MG PO TABS: 650.0000 mg | ORAL_TABLET | Freq: Four times a day (QID) | ORAL | 0 refills | Status: AC | PRN

## 2016-10-09 MED ORDER — THIAMINE HCL 100 MG PO TABS: 100.0000 mg | ORAL_TABLET | Freq: Every day | ORAL | 1 refills | Status: AC

## 2016-10-09 MED ORDER — FERROUS SULFATE 325 (65 FE) MG PO TABS: 325.0000 mg | ORAL_TABLET | Freq: Two times a day (BID) | ORAL | 3 refills | Status: AC

## 2016-10-09 MED ORDER — FOLIC ACID 1 MG PO TABS: 1.0000 mg | ORAL_TABLET | Freq: Every day | ORAL | 1 refills | Status: AC

## 2016-10-09 NOTE — Clinical Social Work Note (Addendum)
CSW met with patient and informed him that he does not qualify for SNF placement.  Patient expressed he understood, CSW asked patient if he can go back to his friend's house, patient stated he will check.  If not patient said he will go to the [employer], he said he is familiar with the location.  Patient was also reminded about the list of resources for housing and boarding houses.  Patient was appreciative of resources given again.  CSW provide cab voucher and gave it to the bedside nurse in case patient is not able to get a ride.  CSW to sign off please reconsult if other social work needs arise.  Jones Broom. [place], MSW, [place]  10/09/2016 4:07 PM

## 2016-10-09 NOTE — Progress Notes (Signed)
Iv and tele removed from patient. Discharge instructions given to patient along with hard copy prescriptions. Verbalized understanding. No distress at this time. Taxi cab called and patient will be transported to [facility].

## 2016-10-09 NOTE — Care Management (Signed)
No physical therapy recommendations.  CSW speaking with patent regarding discharge disposition- where patient will go.   He is not discharging on any medications that requires assistance.

## 2016-10-09 NOTE — Evaluation (Signed)
Physical Therapy Evaluation Patient Details Name: Richard LoosenRonnie Graves MRN: [id number] DOB: 01/06/1951 Today's Date: 10/09/2016   History of Present Illness  Pt. is a 66 y.o. male who was [facility] With a old Proximal Humerus Fracture Typr 4 with abundant Callous Formation. Pt. PMHx includes: Alcohol Abuse, Hepatitis, and HTN.  Clinical Impression  66 yo Male came to ED after falling. He reports that he was trying to get out of the rain and when he ran up concrete steps to the porch he slipped and fell. He has a humerus fracture in LUE. Patient reports walking with SPC as prior level of function. Currently he is mod I for bed mobility and transfers, utilizing SPC. He ambulated 160 feet with SPC, supervision demonstrating slower gait speed with narrow base of support and decreased step length. Gait speed was 1.33 which indicates increased risk for falls. Dynamic standing balance is fair. Patient would benefit from skilled PT intervention to improve balance and gait safety; Recommend possible Outpatient PT referral once discharged to continue to address balance and gait needs.     Follow Up Recommendations Other (comment) (pt does not appear to qualify for STR/SNF; would benefit from follow up PT for balance/gait safety; consider outpatient PT referral as patient does not have a permanent home to go to. )    Equipment Recommendations  None recommended by PT    Recommendations for Other Services       Precautions / Restrictions Precautions Precautions: Fall Required Braces or Orthoses: Other Brace/Splint (LUE immobilizer; ) Restrictions Weight Bearing Restrictions: Yes LUE Weight Bearing: Non weight bearing      Mobility  Bed Mobility Overal bed mobility: Modified Independent Bed Mobility: Supine to Sit;Sit to Supine     Supine to sit: Modified independent (Device/Increase time) Sit to supine: Modified independent (Device/Increase time)   General bed mobility comments: uses elevated  head of bed; however able to transition sit<>Supine with good positioning and safety awareness; requires increased time;   Transfers Overall transfer level: Modified independent Equipment used: Straight cane             General transfer comment: sit<>Stand mod I with SPC; demonstrates good safety awareness and positioning;   Ambulation/Gait Ambulation/Gait assistance: Supervision Ambulation Distance (Feet): 160 Feet Assistive device: Straight cane Gait Pattern/deviations: Step-through pattern;Decreased step length - left;Decreased step length - right;Decreased dorsiflexion - right;Decreased dorsiflexion - left;Narrow base of support;Shuffle Gait velocity: 1.33 feet/sec during 10 feet walk test;  Gait velocity interpretation: <1.8 ft/sec, indicative of risk for recurrent falls General Gait Details: demonstrates slower gait speed; able to demonstrate reciprocal gait pattern with short shuffled steps;   Stairs            Wheelchair Mobility    Modified Rankin (Stroke Patients Only)       Balance Overall balance assessment: Needs assistance Sitting-balance support: No upper extremity supported;Feet supported Sitting balance-Leahy Scale: Good     Standing balance support: Single extremity supported Standing balance-Leahy Scale: Good Standing balance comment: dynamic standing balance is fair;                              Pertinent Vitals/Pain Pain Assessment: 0-10 Pain Score: 10-Worst pain ever Pain Location: LUE Pain Descriptors / Indicators: Aching;Sharp Pain Intervention(s): Limited activity within patient's tolerance;Repositioned;Monitored during session;Premedicated before session    Home Living Family/patient expects to be discharged to:: Unsure  Additional Comments: Per care management, pt. was recently released from jail, and is homeless. He was hoping to go to STR however unsure he would qualify. He states that he could  stay with a friend. Friends house would not have stairs;     Prior Function Level of Independence: Independent with assistive device(s)         Comments: used SPC for ambulation;      Hand Dominance        Extremity/Trunk Assessment   Upper Extremity Assessment Upper Extremity Assessment: Defer to OT evaluation    Lower Extremity Assessment Lower Extremity Assessment: RLE deficits/detail;LLE deficits/detail RLE Deficits / Details: ROM is WFL, intact light touch sensation; strength grossly 4-/5 LLE Deficits / Details: ROM is WFL, intact light touch sensation; strength grossly 4-/5       Communication   Communication: No difficulties  Cognition Arousal/Alertness: Awake/alert Behavior During Therapy: WFL for tasks assessed/performed Overall Cognitive Status: Within Functional Limits for tasks assessed                                        General Comments General comments (skin integrity, edema, etc.): patient wearing LUE immobilizer; reports increased shoulder pain with all mobility;     Exercises     Assessment/Plan    PT Assessment Patient needs continued PT services  PT Problem List Decreased strength;Decreased mobility;Decreased safety awareness;Decreased activity tolerance;Decreased balance       PT Treatment Interventions DME instruction;Therapeutic activities;Gait training;Therapeutic exercise;Balance training;Neuromuscular re-education;Functional mobility training;Patient/family education    PT Goals (Current goals can be found in the Care Plan section)  Acute Rehab PT Goals Patient Stated Goal: "I want to go to short term rehab"  PT Goal Formulation: With patient Time For Goal Achievement: 10/23/16 Potential to Achieve Goals: Fair    Frequency Min 2X/week   Barriers to discharge Other (comment) unsure where patient would go; he reported that he could go to a friends house; it would have no stairs;     Co-evaluation                AM-PAC PT "6 Clicks" Daily Activity  Outcome Measure Difficulty turning over in bed (including adjusting bedclothes, sheets and blankets)?: A Lot Difficulty moving from lying on back to sitting on the side of the bed? : A Little Difficulty sitting down on and standing up from a chair with arms (e.g., wheelchair, bedside commode, etc,.)?: A Little Help needed moving to and from a bed to chair (including a wheelchair)?: None Help needed walking in hospital room?: A Little Help needed climbing 3-5 steps with a railing? : A Little 6 Click Score: 18    End of Session Equipment Utilized During Treatment: Gait belt Activity Tolerance: Patient limited by pain Patient left: in bed;with bed alarm set;with call bell/phone within reach Nurse Communication: Mobility status PT Visit Diagnosis: Unsteadiness on feet (R26.81);Repeated falls (R29.6);Muscle weakness (generalized) (M62.81)    Time: 1610-9604 PT Time Calculation (min) (ACUTE ONLY): 13 min   Charges:   PT Evaluation $PT Eval Low Complexity: 1 Low     PT G Codes:   PT G-Codes **NOT FOR INPATIENT CLASS** Functional Assessment Tool Used: AM-PAC 6 Clicks Basic Mobility;Clinical judgement Functional Limitation: Mobility: Walking and moving around Mobility: Walking and Moving Around Current Status (V4098): At least 40 percent but less than 60 percent impaired, limited or restricted Mobility: Walking and Moving  Around Goal Status [phone]): At least 20 percent but less than 40 percent impaired, limited or restricted     Chelli Yerkes PT, DPT 10/09/2016, 1:11 PM

## 2016-10-09 NOTE — Discharge Summary (Signed)
[facility] [place] [facility]   PATIENT NAME: Richard LoosenRonnie Graves    MR#:  [id number]  DATE OF BIRTH:  03/10/1950  DATE OF ADMISSION:  10/06/2016 ADMITTING PHYSICIAN: Ihor AustinPavan Pyreddy, MD  DATE OF DISCHARGE: 10/09/2016   PRIMARY CARE PHYSICIAN: Patient, No Pcp Per    ADMISSION DIAGNOSIS:  Hypokalemia [E87.6] Acute blood loss anemia [D62] Hyponatremia [E87.1] Fall, initial encounter [W19.XXXA] Other closed displaced fracture of proximal end of left humerus, initial encounter [S42.292A]  DISCHARGE DIAGNOSIS:  Active Problems:   Hypokalemia   Humerus fracture  SECONDARY DIAGNOSIS:   Past Medical History:  Diagnosis Date  . Alcohol abuse   . Hepatitis   . Hypertension     HOSPITAL COURSE:   * Chronic alcoholism   High risk for withdrawal, on CIWA protocol, but currently no signs of withdrawal.   Remained stable in hospital for 3 days.  * Hypokalemia   Replaced oral and receiving IV    Check Mg, and recheck K came up.    * hepatitis   This is mentioned in past history, but we don't have previous records and he does not take any meds at home.   Checked LFTs, stable.  * Anemia iron deficiency, nutrotional, being alcoholic and homeless.   Checked Iron studies   Now found, he had previous visits, and hi was registered under different number- the chart is flaged to merge in computer system.   In that chart, noted his Hb was higher in past.   Will check stool for guiac, he said- he had some dark stools. Still waiting to collect sample.   IV iron one dose today. Hb 7- up to 8 now, monitor. Stable.   As he is stable, can be discharged.  * Active smoking   Councelled to quit smoking- Nicotine patch offered.  * Coccaine use.    Pt confirms last use was more than a week ago.    No signs of withdrawal or overdose.  * Humerus fracture   Sling for now.   Ortho consult appreciated. No surgery.  follow in 2 weeks with them.  DISCHARGE CONDITIONS:    Stable.  CONSULTS OBTAINED:  Treatment Team:  Altamese DillingVachhani, Birney Belshe, MD Lyndle HerrlichBowers, James R, MD  DRUG ALLERGIES:  Not on File  DISCHARGE MEDICATIONS:   Current Discharge Medication List    START taking these medications   Details  acetaminophen (TYLENOL) 325 MG tablet Take 2 tablets (650 mg total) by mouth every 6 (six) hours as needed for mild pain (or Fever >/= 101). Qty: 20 tablet, Refills: 0    ferrous sulfate 325 (65 FE) MG tablet Take 1 tablet (325 mg total) by mouth 2 (two) times daily with a meal. Qty: 60 tablet, Refills: 3    folic acid (FOLVITE) 1 MG tablet Take 1 tablet (1 mg total) by mouth daily. Qty: 30 tablet, Refills: 1    thiamine 100 MG tablet Take 1 tablet (100 mg total) by mouth daily. Qty: 30 tablet, Refills: 1         DISCHARGE INSTRUCTIONS:    Follow with ortho clinic in 2 weeks.  If you experience worsening of your admission symptoms, develop shortness of breath, life threatening emergency, suicidal or homicidal thoughts you must seek medical attention immediately by calling 911 or calling your MD immediately  if symptoms less severe.  You Must read complete instructions/literature along with all the possible adverse reactions/side effects for all the Medicines you take and that have been  prescribed to you. Take any new Medicines after you have completely understood and accept all the possible adverse reactions/side effects.   Please note  You were cared for by a hospitalist during your hospital stay. If you have any questions about your discharge medications or the care you received while you were in the hospital after you are discharged, you can call the unit and asked to speak with the hospitalist on call if the hospitalist that took care of you is not available. Once you are discharged, your primary care physician will handle any further medical issues. Please note that NO REFILLS for any discharge medications will be authorized once you are  discharged, as it is imperative that you return to your primary care physician (or establish a relationship with a primary care physician if you do not have one) for your aftercare needs so that they can reassess your need for medications and monitor your lab values.    Today   CHIEF COMPLAINT:   Chief Complaint  Patient presents with  . Fall  . Arm Injury    Left    HISTORY OF PRESENT ILLNESS:  Richard Graves  is a 66 y.o. male with a known history of alcohol abuse, hepatitis, hypertension presented to the emergency room after he sustained a fall on his neighbor's porch.No history of any head injury. Patient fell and landed on his left shoulder. Left shoulder is swollen and tender. Pain is aching in nature 7 out of 10 on a scale of 1-10. Patient was evaluated with x-ray of the left shoulder and arm which showed humerus fracture. Case was discussed with orthopedic attending recommended sling for now. Patient's alcohol level is elevated. He drinks alcohol on a regular basis. No complaints of any chest pain, shortness of breath. No complaints of any abdominal pain. Potassium level is also low during the workup.   VITAL SIGNS:  Blood pressure 108/66, pulse 74, temperature 98.6 F (37 C), temperature source Oral, resp. rate 16, height 5\' 7"  (1.702 m), weight 56.7 kg (125 lb), SpO2 100 %.  I/O:   Intake/Output Summary (Last 24 hours) at 10/09/16 1252 Last data filed at 10/09/16 1000  Gross per 24 hour  Intake              360 ml  Output              275 ml  Net               85 ml    PHYSICAL EXAMINATION:   GENERAL:  66 y.o.-year-old patient lying in the bed with no acute distress.  EYES: Pupils equal, round, reactive to light and accommodation. No scleral icterus. Extraocular muscles intact.  HEENT: Head atraumatic, normocephalic. Oropharynx and nasopharynx clear.  NECK:  Supple, no jugular venous distention. No thyroid enlargement, no tenderness.  LUNGS: Normal breath sounds  bilaterally, no wheezing, rales,rhonchi or crepitation. No use of accessory muscles of respiration.  CARDIOVASCULAR: S1, S2 normal. No murmurs, rubs, or gallops.  ABDOMEN: Soft, nontender, nondistended. Bowel sounds present. No organomegaly or mass.  EXTREMITIES: No pedal edema, cyanosis, or clubbing. Left upper limb in a sling. NEUROLOGIC: Cranial nerves II through XII are intact. Muscle strength 4/5 in all extremities. Except left Upper limb- not checked due to being in sling. Sensation intact. Gait not checked. No tremers. PSYCHIATRIC: The patient is alert and oriented x 3.  SKIN: No obvious rash, lesion, or ulcer.   DATA REVIEW:   CBC  Recent  Labs Lab 10/09/16 0615  WBC 6.8  HGB 8.0*  HCT 25.2*  PLT 397    Chemistries   Recent Labs Lab 10/06/16 1100  10/08/16 0546  NA  --   < > 132*  K 3.5  < > 4.1  CL  --   < > 101  CO2  --   < > 25  GLUCOSE  --   < > 100*  BUN  --   < > <5*  CREATININE  --   < > 0.51*  CALCIUM  --   < > 8.7*  MG 1.8  --   --   AST 44*  --   --   ALT 32  --   --   ALKPHOS 35*  --   --   BILITOT 1.1  --   --   < > = values in this interval not displayed.  Cardiac Enzymes No results for input(s): TROPONINI in the last 168 hours.  Microbiology Results  Results for orders placed or performed during the hospital encounter of 10/06/16  MRSA PCR Screening     Status: None   Collection Time: 10/06/16  5:44 AM  Result Value Ref Range Status   MRSA by PCR NEGATIVE NEGATIVE Final    Comment:        The GeneXpert MRSA Assay (FDA approved for NASAL specimens only), is one component of a comprehensive MRSA colonization surveillance program. It is not intended to diagnose MRSA infection nor to guide or monitor treatment for MRSA infections.     RADIOLOGY:  No results found.  EKG:  No orders found for this or any previous visit.    Management plans discussed with the patient, family and they are in agreement.  CODE STATUS:     Code  Status Orders        Start     Ordered   10/06/16 0532  Full code  Continuous     10/06/16 0531    Code Status History    Date Active Date Inactive Code Status Order ID Comments User Context   This patient has a current code status but no historical code status.      TOTAL TIME TAKING CARE OF THIS PATIENT: 35 minutes.    Altamese Dilling M.D on 10/09/2016 at 12:52 PM  Between 7am to 6pm - Pager - [phone]  After 6pm go to [email or web address] - password [email or web address]  [facility]  Office  [phone]  CC: Primary care physician; Patient, No Pcp Per   Note: This dictation was prepared with Dragon dictation along with smaller phrase technology. Any transcriptional errors that result from this process are unintentional.

## 2016-10-10 ENCOUNTER — Encounter: Payer: Self-pay | Admitting: Emergency Medicine

## 2016-10-19 ENCOUNTER — Inpatient Hospital Stay: Payer: Medicare Other

## 2016-10-19 ENCOUNTER — Emergency Department: Payer: Medicare Other

## 2016-10-19 ENCOUNTER — Inpatient Hospital Stay
Admission: EM | Admit: 2016-10-19 | Discharge: 2016-11-06 | DRG: 296 | Disposition: E | Payer: Medicare Other | Attending: Specialist | Admitting: Specialist

## 2016-10-19 DIAGNOSIS — F1410 Cocaine abuse, uncomplicated: Secondary | ICD-10-CM | POA: Diagnosis present

## 2016-10-19 DIAGNOSIS — R748 Abnormal levels of other serum enzymes: Secondary | ICD-10-CM | POA: Diagnosis present

## 2016-10-19 DIAGNOSIS — R579 Shock, unspecified: Secondary | ICD-10-CM | POA: Diagnosis present

## 2016-10-19 DIAGNOSIS — R739 Hyperglycemia, unspecified: Secondary | ICD-10-CM | POA: Diagnosis present

## 2016-10-19 DIAGNOSIS — G931 Anoxic brain damage, not elsewhere classified: Secondary | ICD-10-CM | POA: Diagnosis present

## 2016-10-19 DIAGNOSIS — K759 Inflammatory liver disease, unspecified: Secondary | ICD-10-CM | POA: Diagnosis present

## 2016-10-19 DIAGNOSIS — Z515 Encounter for palliative care: Secondary | ICD-10-CM | POA: Diagnosis not present

## 2016-10-19 DIAGNOSIS — J9601 Acute respiratory failure with hypoxia: Secondary | ICD-10-CM | POA: Diagnosis present

## 2016-10-19 DIAGNOSIS — E875 Hyperkalemia: Secondary | ICD-10-CM | POA: Diagnosis present

## 2016-10-19 DIAGNOSIS — I10 Essential (primary) hypertension: Secondary | ICD-10-CM | POA: Diagnosis present

## 2016-10-19 DIAGNOSIS — J9811 Atelectasis: Secondary | ICD-10-CM | POA: Diagnosis present

## 2016-10-19 DIAGNOSIS — Z66 Do not resuscitate: Secondary | ICD-10-CM | POA: Diagnosis not present

## 2016-10-19 DIAGNOSIS — E872 Acidosis: Secondary | ICD-10-CM | POA: Diagnosis present

## 2016-10-19 DIAGNOSIS — N179 Acute kidney failure, unspecified: Secondary | ICD-10-CM | POA: Diagnosis present

## 2016-10-19 DIAGNOSIS — F1010 Alcohol abuse, uncomplicated: Secondary | ICD-10-CM | POA: Diagnosis present

## 2016-10-19 DIAGNOSIS — Z79899 Other long term (current) drug therapy: Secondary | ICD-10-CM | POA: Diagnosis not present

## 2016-10-19 DIAGNOSIS — Z8674 Personal history of sudden cardiac arrest: Secondary | ICD-10-CM

## 2016-10-19 DIAGNOSIS — F17210 Nicotine dependence, cigarettes, uncomplicated: Secondary | ICD-10-CM | POA: Diagnosis present

## 2016-10-19 DIAGNOSIS — D638 Anemia in other chronic diseases classified elsewhere: Secondary | ICD-10-CM | POA: Diagnosis present

## 2016-10-19 DIAGNOSIS — I469 Cardiac arrest, cause unspecified: Secondary | ICD-10-CM | POA: Diagnosis present

## 2016-10-19 DIAGNOSIS — Z452 Encounter for adjustment and management of vascular access device: Secondary | ICD-10-CM

## 2016-10-19 LAB — CBC WITH DIFFERENTIAL/PLATELET
BASOS ABS: 0.1 10*3/uL (ref 0–0.1)
BASOS PCT: 1 %
EOS PCT: 0 %
Eosinophils Absolute: 0 10*3/uL (ref 0–0.7)
HCT: 33.4 % — ABNORMAL LOW (ref 40.0–52.0)
Hemoglobin: 9.9 g/dL — ABNORMAL LOW (ref 13.0–18.0)
Lymphocytes Relative: 31 %
Lymphs Abs: 3 10*3/uL (ref 1.0–3.6)
MCH: 28.9 pg (ref 26.0–34.0)
MCHC: 29.6 g/dL — ABNORMAL LOW (ref 32.0–36.0)
MCV: 97.7 fL (ref 80.0–100.0)
MONO ABS: 0.4 10*3/uL (ref 0.2–1.0)
Monocytes Relative: 5 %
Neutro Abs: 6.2 10*3/uL (ref 1.4–6.5)
Neutrophils Relative %: 63 %
PLATELETS: 373 10*3/uL (ref 150–440)
RBC: 3.42 MIL/uL — ABNORMAL LOW (ref 4.40–5.90)
RDW: 25.3 % — AB (ref 11.5–14.5)
WBC: 9.8 10*3/uL (ref 3.8–10.6)

## 2016-10-19 LAB — MRSA PCR SCREENING: MRSA by PCR: POSITIVE — AB

## 2016-10-19 LAB — COMPREHENSIVE METABOLIC PANEL
ALBUMIN: 2.7 g/dL — AB (ref 3.5–5.0)
ALT: 576 U/L — ABNORMAL HIGH (ref 17–63)
AST: 1272 U/L — AB (ref 15–41)
Alkaline Phosphatase: 102 U/L (ref 38–126)
Anion gap: 20 — ABNORMAL HIGH (ref 5–15)
BUN: 7 mg/dL (ref 6–20)
CHLORIDE: 110 mmol/L (ref 101–111)
CO2: 15 mmol/L — ABNORMAL LOW (ref 22–32)
Calcium: 9.2 mg/dL (ref 8.9–10.3)
Creatinine, Ser: 1.05 mg/dL (ref 0.61–1.24)
GFR calc Af Amer: >60 mL/min (ref >60)
Glucose, Bld: 168 mg/dL — ABNORMAL HIGH (ref 65–99)
POTASSIUM: 5.3 mmol/L — AB (ref 3.5–5.1)
Sodium: 145 mmol/L (ref 135–145)
Total Bilirubin: 0.5 mg/dL (ref 0.3–1.2)
Total Protein: 7.1 g/dL (ref 6.5–8.1)

## 2016-10-19 LAB — PROTIME-INR
INR: 1.81
PROTHROMBIN TIME: 20.8 s — AB (ref 11.4–15.2)

## 2016-10-19 LAB — ETHANOL: ALCOHOL ETHYL (B): 412 mg/dL — AB (ref <5)

## 2016-10-19 LAB — BRAIN NATRIURETIC PEPTIDE: B NATRIURETIC PEPTIDE 5: 321 pg/mL — AB (ref 0.0–100.0)

## 2016-10-19 LAB — TROPONIN I: Troponin I: 0.55 ng/mL (ref <0.03)

## 2016-10-19 LAB — LACTIC ACID, PLASMA: Lactic Acid, Venous: 14.6 mmol/L (ref 0.5–1.9)

## 2016-10-19 LAB — MAGNESIUM: Magnesium: 3 mg/dL — ABNORMAL HIGH (ref 1.7–2.4)

## 2016-10-19 MED ORDER — NOREPINEPHRINE BITARTRATE 1 MG/ML IV SOLN
0.0000 ug/min | INTRAVENOUS | Status: DC
  Administered 2016-10-20: 40 ug/min via INTRAVENOUS
  Administered 2016-10-20: 60 ug/min via INTRAVENOUS
  Filled 2016-10-19 (×10): qty 16

## 2016-10-19 MED ORDER — DOPAMINE-DEXTROSE 3.2-5 MG/ML-% IV SOLN
INTRAVENOUS | Status: AC | PRN
  Administered 2016-10-19: 5 ug/kg/min via INTRAVENOUS

## 2016-10-19 MED ORDER — SODIUM BICARBONATE 8.4 % IV SOLN
50.0000 meq | Freq: Once | INTRAVENOUS | Status: AC
Start: 2016-10-19 — End: 2016-10-20
  Administered 2016-10-20: 50 meq via INTRAVENOUS
  Filled 2016-10-19: qty 50

## 2016-10-19 MED ORDER — SODIUM BICARBONATE 8.4 % IV SOLN
INTRAVENOUS | Status: AC | PRN
  Administered 2016-10-19: 50 meq via INTRAVENOUS

## 2016-10-19 MED ORDER — ONDANSETRON HCL 4 MG PO TABS: 4.0000 mg | ORAL_TABLET | Freq: Four times a day (QID) | ORAL | Status: DC | PRN

## 2016-10-19 MED ORDER — EPINEPHRINE PF 1 MG/10ML IJ SOSY
PREFILLED_SYRINGE | INTRAMUSCULAR | Status: AC | PRN
  Administered 2016-10-19 (×4): 1 via INTRAVENOUS

## 2016-10-19 MED ORDER — SODIUM BICARBONATE 8.4 % IV SOLN
INTRAVENOUS | Status: DC
  Administered 2016-10-20 (×2): via INTRAVENOUS
  Filled 2016-10-19 (×4): qty 150

## 2016-10-19 MED ORDER — EPINEPHRINE PF 1 MG/ML IJ SOLN
0.5000 ug/min | INTRAMUSCULAR | Status: DC
  Administered 2016-10-19: 0.5 ug/min via INTRAVENOUS
  Administered 2016-10-20: 20 ug/min via INTRAVENOUS
  Filled 2016-10-19 (×5): qty 4

## 2016-10-19 MED ORDER — ENOXAPARIN SODIUM 40 MG/0.4ML ~~LOC~~ SOLN
40.0000 mg | SUBCUTANEOUS | Status: DC
  Administered 2016-10-19: 40 mg via SUBCUTANEOUS
  Filled 2016-10-19: qty 0.4

## 2016-10-19 MED ORDER — SODIUM CHLORIDE 0.9 % IV SOLN
INTRAVENOUS | Status: DC
  Administered 2016-10-19 – 2016-10-20 (×2): via INTRAVENOUS

## 2016-10-19 MED ORDER — PANTOPRAZOLE SODIUM 40 MG IV SOLR
40.0000 mg | INTRAVENOUS | Status: DC
  Administered 2016-10-20: 40 mg via INTRAVENOUS
  Filled 2016-10-19: qty 40

## 2016-10-19 MED ORDER — DOPAMINE-DEXTROSE 3.2-5 MG/ML-% IV SOLN
INTRAVENOUS | Status: AC
  Filled 2016-10-19: qty 250

## 2016-10-19 MED ORDER — NOREPINEPHRINE BITARTRATE 1 MG/ML IV SOLN
0.0000 ug/min | INTRAVENOUS | Status: DC
  Administered 2016-10-19: 2 ug/min via INTRAVENOUS
  Filled 2016-10-19: qty 4

## 2016-10-19 MED ORDER — ACETAMINOPHEN 325 MG PO TABS: 650.0000 mg | ORAL_TABLET | Freq: Four times a day (QID) | ORAL | Status: DC | PRN

## 2016-10-19 MED ORDER — ACETAMINOPHEN 650 MG RE SUPP: 650.0000 mg | Freq: Four times a day (QID) | RECTAL | Status: DC | PRN

## 2016-10-19 MED ORDER — ONDANSETRON HCL 4 MG/2ML IJ SOLN: 4.0000 mg | Freq: Four times a day (QID) | INTRAMUSCULAR | Status: DC | PRN

## 2016-10-19 MED ORDER — SODIUM CHLORIDE 0.9 % IV SOLN
INTRAVENOUS | Status: AC | PRN
  Administered 2016-10-19: 1000 mL via INTRAVENOUS

## 2016-10-19 MED ORDER — SODIUM BICARBONATE 8.4 % IV SOLN
50.0000 meq | Freq: Once | INTRAVENOUS | Status: AC
  Administered 2016-10-19: 50 meq via INTRAVENOUS
  Filled 2016-10-19: qty 50

## 2016-10-19 NOTE — Progress Notes (Signed)
elink new patient eval  Got call from [facility] hospitalist earlier -and ER doc - cardiac arrest  Looks stable on camera  D/w APP [place] [facility] - to eval patient  Dr. Kalman ShanMurali Nakeita Styles, M.D., [facility] Pulmonary and Critical Care Medicine Staff Physician [facility] Pager: [phone], If no answer or between  15:00h - 7:00h: call [phone]  10/21/2016 10:30 PM

## 2016-10-19 NOTE — Code Documentation (Signed)
Pulse check. Pulse found. 79bpm.

## 2016-10-19 NOTE — Procedures (Signed)
Central Venous Catheter Insertion Procedure Note - Right  Internal Jugular Richard Graves [id number] 11/29/1950  Procedure: Insertion of Central Venous Catheter Indications: Assessment of intravascular volume, Drug and/or fluid administration and Frequent blood sampling  Procedure Details Consent: Unable to obtain consent because of altered level of consciousness. Time Out: Verified patient identification, verified procedure, site/side was marked, verified correct patient position, special equipment/implants available, medications/allergies/relevent history reviewed, required imaging and test results available.  Performed  Maximum sterile technique was used including antiseptics, cap, gloves, gown, hand hygiene, mask and sheet. Skin prep: Chlorhexidine; local anesthetic administered A antimicrobial bonded/coated triple lumen catheter was placed in the right internal jugular vein using the Seldinger technique.  Evaluation Blood flow good Complications: No apparent complications Patient did tolerate procedure well. Chest X-ray ordered to verify placement.  CXR: pending.  Procedure performed under direct ultrasound guidance for real time vessel cannulation.      Malic Rosten,AG-ACNP Pulmonary & Critical Care

## 2016-10-19 NOTE — ED Provider Notes (Signed)
[facility] Emergency Department Provider Note  ____________________________________________   First MD Initiated Contact with Patient 2016-04-25 1933     (approximate)  I have reviewed the triage vital signs and the nursing notes.   HISTORY  Chief Complaint No chief complaint on file.  Level 5 caveat:  history/ROS limited by acute/critical illness  HPI Richard Graves is a 66 y.o. male with a history, according to the electronic medical record, of alcohol abuse and cocaine abuse who presents by EMS in cardiac arrest.  Reportedly he was found face down on the ground in a shed or small structure of some kind after an unknown period of down time.  He was unresponsive and when the first responders arrived they began CPR.  Through 4 cycles they were advised to not perform a shock on their AED.  When the paramedics arrived they began CPR and performed 3 rounds and administered 3 rounds of epinephrine, calcium, and 1 amp of sodium bicarbonate.  After a total of about 30 minutes of CPR and they got a spontaneous return of circulation with a palpable carotid pulse.  They placed a King airway the start of CPR and he was bagged easily.  As they were getting close to [facility] he became bradycardic and they lost a pulse so they started CPR with the [facility] device.  CPR was in progress when he arrived to the ED.  See hospital course for additional details   Past Medical History:  Diagnosis Date  . Alcohol abuse   . ETOH abuse   . Hepatitis   . Hypertension     Patient Active Problem List   Diagnosis Date Noted  . Cardiac arrest (HCC) 06-28-16  . Hypokalemia 10/06/2016    Past Surgical History:  Procedure Laterality Date  . HIP SURGERY      Prior to Admission medications   Medication Sig Start Date End Date Taking? Authorizing Provider  acetaminophen (TYLENOL) 325 MG tablet Take 2 tablets (650 mg total) by mouth every 6 (six) hours as needed  for mild pain (or Fever >/= 101). 10/09/16   Altamese DillingVachhani, Vaibhavkumar, MD  ferrous sulfate 325 (65 FE) MG tablet Take 1 tablet (325 mg total) by mouth 2 (two) times daily with a meal. 10/09/16   Altamese DillingVachhani, Vaibhavkumar, MD  folic acid (FOLVITE) 1 MG tablet Take 1 tablet (1 mg total) by mouth daily. 10/10/16   Altamese DillingVachhani, Vaibhavkumar, MD  thiamine 100 MG tablet Take 1 tablet (100 mg total) by mouth daily. 10/10/16   Altamese DillingVachhani, Vaibhavkumar, MD    Allergies Patient has no known allergies.  Family History  Problem Relation Age of Onset  . CAD Neg Hx   . Diabetes Neg Hx   . Hypertension Neg Hx     Social History Social History  Substance Use Topics  . Smoking status: Current Every Day Smoker    Packs/day: 1.00    Years: 53.00  . Smokeless tobacco: Never Used  . Alcohol use Yes     Comment: Pt states "drinks beer everyday"    Review of Systems Level 5 caveat:  history/ROS limited by acute/critical illness ____________________________________________   PHYSICAL EXAM:  ED Triage Vitals  Enc Vitals Group     BP 2016-04-25 1942 124/71     Pulse Rate 2016-04-25 1942 85     Resp 2016-04-25 1942 17     Temp 2016-04-25 2130 (!) 88.9 F (31.6 C)     Temp Source 2016-04-25 2130 Rectal  SpO2 10/27/2016 1942 100 %     Weight --      Height --      Head Circumference --      Peak Flow --      Pain Score --      Pain Loc --      Pain Edu? --      Excl. in GC? --      Gen: unresponsive, GCS 3 Cardiovascular: pulseless with sinus bradycardia on the monitor (PEA arrest) Resp: apneic. Breath sounds equal bilaterally with bagging.  King airway in place Abd: nondistended, thin body habitus Neuro: GCS 3, unresponsive to pain  HEENT: King airway in place, gag reflex absent.  Pupils are fixed and dilated.  Head is atraumatic. Neck: No crepitus  Musculoskeletal: No deformity  Skin: warm  ____________________________________________   LABS (all labs ordered are listed, but only abnormal results are  displayed)  Labs Reviewed  CBC WITH DIFFERENTIAL/PLATELET - Abnormal; Notable for the following:       Result Value   RBC 3.42 (*)    Hemoglobin 9.9 (*)    HCT 33.4 (*)    MCHC 29.6 (*)    RDW 25.3 (*)    All other components within normal limits  COMPREHENSIVE METABOLIC PANEL - Abnormal; Notable for the following:    Potassium 5.3 (*)    CO2 15 (*)    Glucose, Bld 168 (*)    Albumin 2.7 (*)    AST 1,272 (*)    ALT 576 (*)    Anion gap 20 (*)    All other components within normal limits  LACTIC ACID, PLASMA - Abnormal; Notable for the following:    Lactic Acid, Venous 14.6 (*)    All other components within normal limits  ETHANOL - Abnormal; Notable for the following:    Alcohol, Ethyl (B) 412 (*)    All other components within normal limits  BRAIN NATRIURETIC PEPTIDE - Abnormal; Notable for the following:    B Natriuretic Peptide 321.0 (*)    All other components within normal limits  PROTIME-INR - Abnormal; Notable for the following:    Prothrombin Time 20.8 (*)    All other components within normal limits  TROPONIN I - Abnormal; Notable for the following:    Troponin I 0.55 (*)    All other components within normal limits  MAGNESIUM - Abnormal; Notable for the following:    Magnesium 3.0 (*)    All other components within normal limits  BLOOD GAS, ARTERIAL - Abnormal; Notable for the following:    pH, Arterial 6.90 (*)    pO2, Arterial 141 (*)    Bicarbonate 9.0 (*)    Acid-base deficit 23.3 (*)    All other components within normal limits  MRSA PCR SCREENING  LACTIC ACID, PLASMA  URINALYSIS, ROUTINE W REFLEX MICROSCOPIC  URINE DRUG SCREEN, QUALITATIVE ([facility] ONLY)  COMPREHENSIVE METABOLIC PANEL  CBC  TROPONIN I  TROPONIN I  TROPONIN I   ____________________________________________  EKG  ED ECG REPORT I, Norell Brisbin, the attending physician, personally viewed and interpreted this ECG.  Date: 10/25/2016 EKG Time: 19:32 Rate: 84 Rhythm: accelerated  junctional rhythm QRS Axis: normal Intervals: slightly prolonged QTc 506 ms ST/T Wave abnormalities: Non-specific ST segment / T-wave changes, but no evidence of acute ischemia. Narrative Interpretation: no evidence of acute ischemia   ____________________________________________  RADIOLOGY   Dg Chest Portable 1 View  Result Date: 10/21/2016 CLINICAL DATA:  Cardiac arrest EXAM: PORTABLE CHEST  1 VIEW COMPARISON:  None. FINDINGS: Heart is borderline enlarged with aortic atherosclerosis. Endotracheal tube is noted with tip 5.1 cm above the carina. Atelectasis is seen in the left lower lobe. Acute minimally displaced right lateral sixth rib fracture is identified without pneumothorax. No pleural effusion. Mild vascular congestion is noted. IMPRESSION: 1. Cardiomegaly with aortic atherosclerosis and mild vascular congestion. 2. Endotracheal tube tip in satisfactory position 5.1 cm above the carina. 3. Minimally displaced right lateral sixth rib fracture. Electronically Signed   By: Tollie Eth M.D.   On: 10/23/2016 20:29    ____________________________________________   PROCEDURES  Critical Care performed: Yes, see critical care procedure note(s)   Procedure(s) performed:   .Critical Care Performed by: Loleta Rose Authorized by: Loleta Rose   Critical care provider statement:    Critical care time (minutes):  60   Critical care time was exclusive of:  Separately billable procedures and treating other patients   Critical care was necessary to treat or prevent imminent or life-threatening deterioration of the following conditions:  Cardiac failure, circulatory failure, CNS failure or compromise and respiratory failure   Critical care was time spent personally by me on the following activities:  Development of treatment plan with patient or surrogate, discussions with consultants, evaluation of patient's response to treatment, examination of patient, obtaining history from patient or  surrogate, ordering and performing treatments and interventions, ordering and review of laboratory studies, ordering and review of radiographic studies, pulse oximetry, re-evaluation of patient's condition and review of old charts Date/Time: 10/17/2016 8:41 PM Performed by: Loleta Rose Pre-anesthesia Checklist: Emergency Drugs available and Patient identified Oxygen Delivery Method: Ambu bag Preoxygenation: Pre-oxygenation with 100% oxygen Ventilation: Mask ventilation without difficulty Laryngoscope Size: Glidescope and 4 Tube size: 7.5 mm Number of attempts: 1 Placement Confirmation: ETT inserted through vocal cords under direct vision,  CO2 detector and Breath sounds checked- equal and bilateral Secured at: 24 cm Tube secured with: ETT holder Dental Injury: Teeth and Oropharynx as per pre-operative assessment         ____________________________________________   INITIAL IMPRESSION / ASSESSMENT AND PLAN / ED COURSE  Pertinent labs & imaging results that were available during my care of the patient were reviewed by me and considered in my medical decision making (see chart for details).  The patient was in PEA arrest upon arrival.  We perform CPR according to ACLS out rhythms and after about 3 rounds of CPR (see nursing notes for additional details) he regained spontaneous pulse with sinus rhythm with a heart rate of about 80.  His blood pressure was low at about 70/45 and I ordered a dopamine drip but before we can get the dopamine running he coded again.  After another milligram of epinephrine and another round of CPR we again regained spontaneous circulation.  He is now on a dopamine drip and maintaining a pulse with a blood pressure of about 125/71.   Clinical Course as of Oct 20 2250  Thu Oct 19, 2016  2001 Documentation delayed due to critical illness.  We were in the process of settling the patient and with the Foley and he was on the ventilator and we are obtaining post  intubation x-ray when he became increasingly bradycardic and then lost his pulse again.  I authorize increasing the dopamine drip to 20 mcg/kg/m and we resumed chest compressions.  He received another milligram of epinephrine and after 3-4 minutes of chest compressions he had regained spontaneous cardiac activity with sinus rhythm in the  80s and a palpable pulse.  I have re-paged the intensivist and we will plan to admit him to the ICU as soon as possible.    [CF]  2004 Spoke with intensivist, agreed not appropriate for cooling protocol.  He will contact local ICU NP, and I will contact hospitalist.  [CF]  2044 Documentation again delayed.  The patient coded again and again after another round of CPR we regained spontaneous circulation.  He is now on a dopamine drip at 20 mcg/kg/m and an epinephrine drip is being titrated up to affect.  We are awaiting ICU bed.  [CF]  2045 Appropriate ETT placement on CXR DG Chest Portable 1 View [CF]    Clinical Course User Index [CF] Loleta Rose, MD   (Delayed documentation)  Spoke by phone with patient's sister who confirms he was full code.  Updated her name and phone number into demographics Fermin Schwab).  Patient remained hemodynamically stable on dopamine and epi drips.  I did not send the patient to head CT because he had no sign of trauma and because he was far too unstable to go to the scanner.  ICU team (both MD and NP) came to the ED to see the patient and I spoke with them in person.  They will take to the ICU, and will obtain CTs if/when necessary. ____________________________________________  FINAL CLINICAL IMPRESSION(S) / ED DIAGNOSES  Final diagnoses:  Cardiopulmonary arrest with successful resuscitation [facility])     MEDICATIONS GIVEN DURING THIS VISIT:  Medications  DOPamine (INTROPIN) 3.2-5 MG/ML-% infusion (not administered)  EPINEPHrine (ADRENALIN) 4 mg in dextrose 5 % 250 mL (0.016 mg/mL) infusion (4.5 mcg/min Intravenous Rate/Dose  Change 11/13/16 2105)  enoxaparin (LOVENOX) injection 40 mg (40 mg Subcutaneous Given by Other 11-13-2016 2157)  0.9 %  sodium chloride infusion ( Intravenous New Bag/Given 11-13-2016 2153)  acetaminophen (TYLENOL) tablet 650 mg (not administered)    Or  acetaminophen (TYLENOL) suppository 650 mg (not administered)  ondansetron (ZOFRAN) tablet 4 mg (not administered)    Or  ondansetron (ZOFRAN) injection 4 mg (not administered)  norepinephrine (LEVOPHED) 4 mg in dextrose 5 % 250 mL (0.016 mg/mL) infusion (6 mcg/min Intravenous Rate/Dose Change November 13, 2016 2248)  sodium bicarbonate injection 50 mEq (not administered)  sodium bicarbonate injection 50 mEq (not administered)  sodium bicarbonate 150 mEq in dextrose 5 % 1,000 mL infusion (not administered)  EPINEPHrine (ADRENALIN) 1 MG/10ML injection (1 Syringe Intravenous Given 2016/11/13 2015)  sodium bicarbonate injection (50 mEq Intravenous Given 2016-11-13 1931)  0.9 %  sodium chloride infusion (1,000 mLs Intravenous New Bag/Given 2016/11/13 1927)  DOPamine (INTROPIN) 800 mg in dextrose 5 % 250 mL (3.2 mg/mL) infusion (20 mcg/kg/min  56.7 kg Intravenous Rate/Dose Change November 13, 2016 1959)     NEW OUTPATIENT MEDICATIONS STARTED DURING THIS VISIT:  Current Discharge Medication List      Current Discharge Medication List      Current Discharge Medication List       Note:  This document was prepared using Dragon voice recognition software and may include unintentional dictation errors.    Loleta Rose, MD 11/13/16 2257

## 2016-10-19 NOTE — Code Documentation (Signed)
Pulse found. cpr stopped.

## 2016-10-19 NOTE — Code Documentation (Signed)
Pulse check. Pulse found.  

## 2016-10-19 NOTE — Code Documentation (Signed)
Pulse found. CPR stopped 

## 2016-10-19 NOTE — Code Documentation (Signed)
ED Zoll pads placed

## 2016-10-19 NOTE — Code Documentation (Signed)
Lost pulse. CPR resumed

## 2016-10-19 NOTE — Code Documentation (Signed)
Lost pulse. cpr resumed

## 2016-10-19 NOTE — Progress Notes (Signed)
Transported pt to ICU 15 on Vent without incident. Report given to ICU RT.

## 2016-10-19 NOTE — H&P (Signed)
[employer] [facility] [facility] [place] [facility]   PATIENT NAME: Richard Graves    MR#:  [id number]  DATE OF BIRTH:  06/27/1950  DATE OF ADMISSION:  02-24-16  PRIMARY CARE PHYSICIAN: Patient, No Pcp Per   REQUESTING/REFERRING PHYSICIAN: dr York Ceriseforbach  CHIEF COMPLAINT:   PEA arrest HISTORY OF PRESENT ILLNESS:  Richard Graves  is a 66 y.o. male with a known history of EtOH abuse and cocaine abuse who was recently discharged from the hospital for EtOH withdrawal who presents after cardiac arrest. Patient was found down for unknown of time and CPR was performed by EMS. He had 3 rounds of CPR including epinephrine prior to coming to the emergency room. He had 3 rounds of CPR while in the emergency room as well. He was initially found to be PEA arrest. Testim level is 5.3 and he was treated for this Patient is now intubated and on dopamine  Intensivist has been contacted. Due to history of EtOH and cocaine patient was not a candidate for code ice as per ER physician in speaking with intensivist on call.  PAST MEDICAL HISTORY:   Past Medical History:  Diagnosis Date  . Alcohol abuse   . ETOH abuse   . Hepatitis   . Hypertension     PAST SURGICAL HISTORY:   Past Surgical History:  Procedure Laterality Date  . HIP SURGERY      SOCIAL HISTORY:   Social History  Substance Use Topics  . Smoking status: Current Every Day Smoker    Packs/day: 1.00    Years: 53.00  . Smokeless tobacco: Never Used  . Alcohol use Yes     Comment: Pt states "drinks beer everyday"    FAMILY HISTORY:   Family History  Problem Relation Age of Onset  . CAD Neg Hx   . Diabetes Neg Hx   . Hypertension Neg Hx     DRUG ALLERGIES:  No Known Allergies  REVIEW OF SYSTEMS:   Review of Systems  Unable to perform ROS: Acuity of condition    MEDICATIONS AT HOME:   Prior to Admission medications   Medication Sig Start Date End Date Taking? Authorizing Provider  acetaminophen (TYLENOL) 325 MG tablet  Take 2 tablets (650 mg total) by mouth every 6 (six) hours as needed for mild pain (or Fever >/= 101). 10/09/16   Altamese DillingVachhani, Vaibhavkumar, MD  ferrous sulfate 325 (65 FE) MG tablet Take 1 tablet (325 mg total) by mouth 2 (two) times daily with a meal. 10/09/16   Altamese DillingVachhani, Vaibhavkumar, MD  folic acid (FOLVITE) 1 MG tablet Take 1 tablet (1 mg total) by mouth daily. 10/10/16   Altamese DillingVachhani, Vaibhavkumar, MD  thiamine 100 MG tablet Take 1 tablet (100 mg total) by mouth daily. 10/10/16   Altamese DillingVachhani, Vaibhavkumar, MD      VITAL SIGNS:  SpO2 97 %.  PHYSICAL EXAMINATION:   Physical Exam  Constitutional: No distress.  Thin, frail cachectic Intubated sedated  HENT:  Head: Normocephalic.  Eyes: No scleral icterus.  Pupils very sluggish 3 mm bilaterally  Neck: Neck supple. No JVD present. No tracheal deviation present. No thyromegaly present.  Cardiovascular: Normal rate, regular rhythm and normal heart sounds.  Exam reveals no gallop and no friction rub.   No murmur heard. Pulmonary/Chest: Effort normal. No respiratory distress. He has no wheezes. He has no rales. He exhibits no tenderness.  Bilateral rhonchi  Abdominal: Soft. Bowel sounds are normal. He exhibits no distension and no mass. There is no tenderness.  There is no rebound and no guarding.  Musculoskeletal: He exhibits no edema.  Neurological:  Intubated and sedated Negative Babinski  Skin: Skin is warm. No rash noted. No erythema.      LABORATORY PANEL:   CBC  Recent Labs Lab 11/04/2016 1933  WBC 9.8  HGB 9.9*  HCT 33.4*  PLT 373   ------------------------------------------------------------------------------------------------------------------  Chemistries   Recent Labs Lab 10/23/2016 1933  NA 145  K 5.3*  CL 110  CO2 15*  GLUCOSE 168*  BUN 7  CREATININE 1.05  CALCIUM 9.2  AST 1,272*  ALT 576*  ALKPHOS 102  BILITOT 0.5    ------------------------------------------------------------------------------------------------------------------  Cardiac Enzymes No results for input(s): TROPONINI in the last 168 hours. ------------------------------------------------------------------------------------------------------------------  RADIOLOGY:  Dg Chest Portable 1 View  Result Date: 10/30/2016 CLINICAL DATA:  Cardiac arrest EXAM: PORTABLE CHEST 1 VIEW COMPARISON:  None. FINDINGS: Heart is borderline enlarged with aortic atherosclerosis. Endotracheal tube is noted with tip 5.1 cm above the carina. Atelectasis is seen in the left lower lobe. Acute minimally displaced right lateral sixth rib fracture is identified without pneumothorax. No pleural effusion. Mild vascular congestion is noted. IMPRESSION: 1. Cardiomegaly with aortic atherosclerosis and mild vascular congestion. 2. Endotracheal tube tip in satisfactory position 5.1 cm above the carina. 3. Minimally displaced right lateral sixth rib fracture. Electronically Signed   By: Tollie Eth M.D.   On: 10/28/2016 20:29    EKG:   Most recent EKG is normal sinus rhythm no ST elevation or depression IMPRESSION AND PLAN:   66 year old male with EtOH and cocaine abuse recently discharged from the hospital for EtOH and hypokalemia.  1. PEA cardiac arrest status post CPR 6 Patient is now intubated and sedated I have spoken with intensivist. Ventilator management as per intensivist Continue telemetry monitoring Cardiology consultation requested Echocardiogram in a.m. Continue dopamine Urine drug screen and urine analysis is still pending  2. Cocaine abuse  3. EtOH abuse:  4. Anemia of chronic disease due to EtOH  5. Hyperkalemia: This is been treated with sodium bicarbonate Repeat BMP in a.m.  Patient at high risk of cardiac arrest again  All the records are reviewed and case discussed with ED provider.  CODE STATUS: full  Critical care TOTAL TIME TAKING  CARE OF THIS PATIENT: 65 minutes.    Tom Macpherson M.D on 10/22/2016 at 8:33 PM  Between 7am to 6pm - Pager - [phone]  After 6pm go to [email or web address] - password [email or web address]  [facility]  Office  [phone]  CC: Primary care physician; Patient, No Pcp Per

## 2016-10-19 NOTE — Consult Note (Signed)
PULMONARY / CRITICAL CARE MEDICINE   Name: Richard Graves MRN: [id number] DOB: 1950/06/05    ADMISSION DATE:  Oct 27, 2016  CONSULTATION DATE:  10/27/2016  REFERRING MD:  Dr. Juliene Pina  CHIEF COMPLAINT:  PEA arrest  HISTORY OF PRESENT ILLNESS:   Richard Graves is a 66 year old male with known history of Alcohol abuse, Hepatitis and Hypertension.  Patient was recently discharged from the hospital for ETOH withdrawal.  Patient was found down for unknown period of time.  EMS arrived and did chest compression for almost 30 minutes with ROSC.Marland Kitchen  When the patient was brought to ER he was again noted to be in PEA arrest . Patient lost his pulse three times in the ED therefore three rounds of CPR was performed .  Patient had ROSC.  Patient was intubated and started on pressors.  CCM team was consulted for further management.  PAST MEDICAL HISTORY :  He  has a past medical history of Alcohol abuse; ETOH abuse; Hepatitis; and Hypertension.  PAST SURGICAL HISTORY: He  has a past surgical history that includes Hip surgery.  No Known Allergies  No current facility-administered medications on file prior to encounter.    Current Outpatient Prescriptions on File Prior to Encounter  Medication Sig  . acetaminophen (TYLENOL) 325 MG tablet Take 2 tablets (650 mg total) by mouth every 6 (six) hours as needed for mild pain (or Fever >/= 101).  . ferrous sulfate 325 (65 FE) MG tablet Take 1 tablet (325 mg total) by mouth 2 (two) times daily with a meal.  . folic acid (FOLVITE) 1 MG tablet Take 1 tablet (1 mg total) by mouth daily.  Marland Kitchen thiamine 100 MG tablet Take 1 tablet (100 mg total) by mouth daily.    FAMILY HISTORY:  His indicated that his mother is deceased. He indicated that his father is deceased. He indicated that the status of his neg hx is unknown.    SOCIAL HISTORY: He  reports that he has been smoking.  He has a 53.00 pack-year smoking history. He has never used smokeless tobacco. He reports that he  drinks alcohol. He reports that he does not use drugs.  REVIEW OF SYSTEMS:   Unable to obtain  SUBJECTIVE:  Unable to Obtain  VITAL SIGNS: BP (!) 78/56 (BP Location: Left Arm)   Pulse 74   Temp (!) 88.9 F (31.6 C) (Rectal)   Resp 18   SpO2 96%   HEMODYNAMICS:    VENTILATOR SETTINGS: Vent Mode: AC FiO2 (%):  [60 %] 60 % Set Rate:  [18 bmp] 18 bmp Vt Set:  [400 mL] 400 mL PEEP:  [5 cmH20] 5 cmH20  INTAKE / OUTPUT: No intake/output data recorded.  PHYSICAL EXAMINATION: General:  Ill appearing male, intubated and on mechanical ventilation Neuro:  Unable to assess HEENT:  AT,[place], Pupils non reactive,no gag reflex Cardiovascular: s1s2,regular, no m/r/g Lungs:  Rhonchorus, no crackles, no wheezes noted Abdomen: soft,nontender,hypoactive  Musculoskeletal:  No edema,cyanosis Skin:  Warm,dry and intact  LABS:  BMET  Recent Labs Lab October 27, 2016 1933  NA 145  K 5.3*  CL 110  CO2 15*  BUN 7  CREATININE 1.05  GLUCOSE 168*    Electrolytes  Recent Labs Lab 10-27-16 1933  CALCIUM 9.2  MG 3.0*    CBC  Recent Labs Lab 27-Oct-2016 1933  WBC 9.8  HGB 9.9*  HCT 33.4*  PLT 373    Coag's  Recent Labs Lab 10/27/2016 1933  INR 1.81  Sepsis Markers  Recent Labs Lab 11/01/2016 1933  LATICACIDVEN 14.6*    ABG No results for input(s): PHART, PCO2ART, PO2ART in the last 168 hours.  Liver Enzymes  Recent Labs Lab 10/13/2016 1933  AST 1,272*  ALT 576*  ALKPHOS 102  BILITOT 0.5  ALBUMIN 2.7*    Cardiac Enzymes  Recent Labs Lab 10/07/2016 1933  TROPONINI 0.55*    Glucose No results for input(s): GLUCAP in the last 168 hours.  Imaging Dg Chest Portable 1 View  Result Date: 10/29/2016 CLINICAL DATA:  Cardiac arrest EXAM: PORTABLE CHEST 1 VIEW COMPARISON:  None. FINDINGS: Heart is borderline enlarged with aortic atherosclerosis. Endotracheal tube is noted with tip 5.1 cm above the carina. Atelectasis is seen in the left lower lobe. Acute minimally  displaced right lateral sixth rib fracture is identified without pneumothorax. No pleural effusion. Mild vascular congestion is noted. IMPRESSION: 1. Cardiomegaly with aortic atherosclerosis and mild vascular congestion. 2. Endotracheal tube tip in satisfactory position 5.1 cm above the carina. 3. Minimally displaced right lateral sixth rib fracture. Electronically Signed   By: Tollie Ethavid  Kwon M.D.   On: 10/26/2016 20:29     STUDIES:  none  CULTURES: none  ANTIBIOTICS: none  SIGNIFICANT EVENTS: 9/13 Patient admitted to ICU after PEA arrest, intubated and on mechanical ventilation  LINES/TUBES: 9/13 ET tube>> 9/13 Right Internal Jugular>>  DISCUSSION: 66 year old male with history of polysubstance abuse found down for unknown period of time.  Was in PEA arrest X3, Intubated and on mechanical ventilation requiring Vasopressors at this time.  ASSESSMENT / PLAN:  PULMONARY A: Acute Respiratory failure s/p PEA arrest SevereMetabolic acidosis Shock P:   Vent Settings Established VAP bundle implemented NaHCO3 X2 Continue NaHCO3 gtt CXR in am Routine ABG   CARDIOVASCULAR A:  PEA arrest x3 Shock P:  Continue levo/epi gtt Keep MAP goal>60 F/U echo Trend troponin Cardiology consulted Trend troponin  RENAL A:   Hyperkalemia P:   Follow BMET Replace electrolytes per usual guidelines  GASTROINTESTINAL A:   Elevated liver enzymes possibly due to shock P:   Trend AST/ALT Protonix for GIP  HEMATOLOGIC A:   Anemia of Chronic disease P:  Lovenox for DVT prophylaxis   INFECTIOUS A:   Lactic acidosis P:   Trend lactic acid Monitor Fever,CBC No need of antibiotics at this time  ENDOCRINE A:   hyperglycemia P:   Blood glucose checks with [facility] COVERAGE  NEUROLOGIC A:   Acute Encephalopathy related to PEA ARREST and unknown down time Hx of Cocaine abuse P:   RASS goal: 0 to -1 Will follow [place] screen Patient is not a cooling cadidate Will obtain CT  head  [name],AG-ACNP Pulmonary and Critical Care Medicine [facility], [facility]   10/26/2016, 10:21 PM

## 2016-10-19 NOTE — Code Documentation (Signed)
Patient is PEA on monitor, CPR Started.

## 2016-10-19 NOTE — Code Documentation (Signed)
Patient arrived via EMS with an unwitnessed down and unknown down time. [place] [facility] arrived first and started CPR with 4 no shocks advised. FD also started IO in the right tibia and placed a King airway. EMS gave 3 epi's, 1 narcan, 1 sodium bicarb, 1 calcium and Samuel BoucheLucas was applied. EMS initial lungs was wet, prior to arrival lungs clear. Upon arrival Samuel BoucheLucas was restarted. Total CPR time for FD & EMS around 30 minutes. Vitals hr 80's, CO2 60.

## 2016-10-19 NOTE — Code Documentation (Signed)
Lost pulse. CPR Resumed.

## 2016-10-20 ENCOUNTER — Inpatient Hospital Stay: Payer: Medicare Other

## 2016-10-20 DIAGNOSIS — I469 Cardiac arrest, cause unspecified: Secondary | ICD-10-CM

## 2016-10-20 LAB — CBC
HEMATOCRIT: 32.6 % — AB (ref 40.0–52.0)
HEMATOCRIT: 24.6 % — AB (ref 40.0–52.0)
Hemoglobin: 9.3 g/dL — ABNORMAL LOW (ref 13.0–18.0)
Hemoglobin: 6.9 g/dL — ABNORMAL LOW (ref 13.0–18.0)
MCH: 27.8 pg (ref 26.0–34.0)
MCH: 27.9 pg (ref 26.0–34.0)
MCHC: 28.5 g/dL — AB (ref 32.0–36.0)
MCHC: 28 g/dL — ABNORMAL LOW (ref 32.0–36.0)
MCV: 97.4 fL (ref 80.0–100.0)
MCV: 99.6 fL (ref 80.0–100.0)
PLATELETS: 373 10*3/uL (ref 150–440)
PLATELETS: 209 10*3/uL (ref 150–440)
RBC: 3.35 MIL/uL — ABNORMAL LOW (ref 4.40–5.90)
RBC: 2.47 MIL/uL — ABNORMAL LOW (ref 4.40–5.90)
RDW: 25.4 % — AB (ref 11.5–14.5)
RDW: 24.6 % — AB (ref 11.5–14.5)
WBC: 24.2 10*3/uL — AB (ref 3.8–10.6)
WBC: 17.2 10*3/uL — AB (ref 3.8–10.6)

## 2016-10-20 LAB — ABO/RH: ABO/RH(D): A POS

## 2016-10-20 LAB — GLUCOSE, CAPILLARY
Glucose-Capillary: 88 mg/dL (ref 65–99)
Glucose-Capillary: 124 mg/dL — ABNORMAL HIGH (ref 65–99)
Glucose-Capillary: 191 mg/dL — ABNORMAL HIGH (ref 65–99)
Glucose-Capillary: 115 mg/dL — ABNORMAL HIGH (ref 65–99)
Glucose-Capillary: 107 mg/dL — ABNORMAL HIGH (ref 65–99)

## 2016-10-20 LAB — COMPREHENSIVE METABOLIC PANEL
ALBUMIN: 2.2 g/dL — AB (ref 3.5–5.0)
ALT: 1247 U/L — ABNORMAL HIGH (ref 17–63)
AST: 4468 U/L — AB (ref 15–41)
Alkaline Phosphatase: 195 U/L — ABNORMAL HIGH (ref 38–126)
Anion gap: 18 — ABNORMAL HIGH (ref 5–15)
BILIRUBIN TOTAL: 1.2 mg/dL (ref 0.3–1.2)
BUN: 9 mg/dL (ref 6–20)
CHLORIDE: 111 mmol/L (ref 101–111)
CO2: 21 mmol/L — ABNORMAL LOW (ref 22–32)
Calcium: 7.3 mg/dL — ABNORMAL LOW (ref 8.9–10.3)
Creatinine, Ser: 2.04 mg/dL — ABNORMAL HIGH (ref 0.61–1.24)
GFR calc Af Amer: 38 mL/min — ABNORMAL LOW (ref >60)
GFR, EST NON AFRICAN AMERICAN: 33 mL/min — AB (ref >60)
GLUCOSE: 210 mg/dL — AB (ref 65–99)
POTASSIUM: 4.8 mmol/L (ref 3.5–5.1)
Sodium: 150 mmol/L — ABNORMAL HIGH (ref 135–145)
TOTAL PROTEIN: 5.5 g/dL — AB (ref 6.5–8.1)

## 2016-10-20 LAB — BASIC METABOLIC PANEL
Anion gap: 25 — ABNORMAL HIGH (ref 5–15)
BUN: 10 mg/dL (ref 6–20)
CALCIUM: 6.4 mg/dL — AB (ref 8.9–10.3)
CO2: 24 mmol/L (ref 22–32)
CREATININE: 2.6 mg/dL — AB (ref 0.61–1.24)
Chloride: 108 mmol/L (ref 101–111)
GFR calc non Af Amer: 24 mL/min — ABNORMAL LOW (ref >60)
GFR, EST AFRICAN AMERICAN: 28 mL/min — AB (ref >60)
Glucose, Bld: 118 mg/dL — ABNORMAL HIGH (ref 65–99)
Potassium: 4.8 mmol/L (ref 3.5–5.1)
Sodium: 157 mmol/L — ABNORMAL HIGH (ref 135–145)

## 2016-10-20 LAB — PROCALCITONIN: Procalcitonin: 1.4 ng/mL

## 2016-10-20 LAB — TROPONIN I
TROPONIN I: 54.95 ng/mL — AB (ref <0.03)
Troponin I: >65 ng/mL (ref <0.03)
Troponin I: >65 ng/mL (ref <0.03)

## 2016-10-20 LAB — LACTIC ACID, PLASMA
Lactic Acid, Venous: 13.7 mmol/L (ref 0.5–1.9)
Lactic Acid, Venous: 2.1 mmol/L (ref 0.5–1.9)

## 2016-10-20 LAB — ACETAMINOPHEN LEVEL: Acetaminophen (Tylenol), Serum: <10 ug/mL — ABNORMAL LOW (ref 10–30)

## 2016-10-20 LAB — SALICYLATE LEVEL

## 2016-10-20 MED ORDER — HYDROCORTISONE NA SUCCINATE PF 100 MG IJ SOLR
100.0000 mg | Freq: Three times a day (TID) | INTRAMUSCULAR | Status: DC
  Administered 2016-10-20: 100 mg via INTRAVENOUS
  Filled 2016-10-20: qty 2

## 2016-10-20 MED ORDER — VANCOMYCIN HCL IN DEXTROSE 1-5 GM/200ML-% IV SOLN
1000.0000 mg | Freq: Once | INTRAVENOUS | Status: AC
  Administered 2016-10-20: 1000 mg via INTRAVENOUS
  Filled 2016-10-20: qty 200

## 2016-10-20 MED ORDER — ORAL CARE MOUTH RINSE: 15.0000 mL | OROMUCOSAL | Status: DC

## 2016-10-20 MED ORDER — SODIUM CHLORIDE 0.9 % IV SOLN: Freq: Once | INTRAVENOUS | Status: DC

## 2016-10-20 MED ORDER — PIPERACILLIN-TAZOBACTAM 3.375 G IVPB
3.3750 g | Freq: Three times a day (TID) | INTRAVENOUS | Status: DC
  Administered 2016-10-20: 3.375 g via INTRAVENOUS
  Filled 2016-10-20: qty 50

## 2016-10-20 MED ORDER — SODIUM BICARBONATE 8.4 % IV SOLN
INTRAVENOUS | Status: AC
  Filled 2016-10-20: qty 100

## 2016-10-20 MED ORDER — INSULIN ASPART 100 UNIT/ML ~~LOC~~ SOLN
2.0000 [IU] | SUBCUTANEOUS | Status: DC
  Administered 2016-10-20: 4 [IU] via SUBCUTANEOUS
  Filled 2016-10-20: qty 1

## 2016-10-20 MED ORDER — GLYCOPYRROLATE 0.2 MG/ML IJ SOLN: 0.2000 mg | INTRAMUSCULAR | Status: DC | PRN

## 2016-10-20 MED ORDER — SODIUM CHLORIDE 0.9 % IV SOLN
0.0000 ug/min | INTRAVENOUS | Status: DC
  Administered 2016-10-20: 400 ug/min via INTRAVENOUS
  Administered 2016-10-20: 20 ug/min via INTRAVENOUS
  Administered 2016-10-20: 350 ug/min via INTRAVENOUS
  Administered 2016-10-20: 400 ug/min via INTRAVENOUS
  Filled 2016-10-20 (×7): qty 4

## 2016-10-20 MED ORDER — MORPHINE SULFATE (PF) 2 MG/ML IV SOLN: 1.0000 mg | INTRAVENOUS | Status: DC | PRN

## 2016-10-20 MED ORDER — SODIUM BICARBONATE 8.4 % IV SOLN
200.0000 meq | Freq: Once | INTRAVENOUS | Status: AC
  Administered 2016-10-20: 200 meq via INTRAVENOUS
  Filled 2016-10-20: qty 100

## 2016-10-20 MED ORDER — DILTIAZEM HCL 25 MG/5ML IV SOLN
10.0000 mg | Freq: Once | INTRAVENOUS | Status: DC
  Filled 2016-10-20 (×2): qty 5

## 2016-10-20 MED ORDER — CHLORHEXIDINE GLUCONATE 0.12% ORAL RINSE (MEDLINE KIT)
15.0000 mL | Freq: Two times a day (BID) | OROMUCOSAL | Status: DC
  Administered 2016-10-20: 15 mL via OROMUCOSAL

## 2016-10-20 MED ORDER — VASOPRESSIN 20 UNIT/ML IV SOLN
0.0400 [IU]/min | INTRAVENOUS | Status: DC
  Administered 2016-10-20: 0.04 [IU]/min via INTRAVENOUS
  Filled 2016-10-20: qty 2

## 2016-10-20 MED ORDER — DILTIAZEM HCL 100 MG IV SOLR
5.0000 mg/h | INTRAVENOUS | Status: DC
  Filled 2016-10-20 (×2): qty 100

## 2016-10-20 MED FILL — Medication: Qty: 1 | Status: AC

## 2016-10-21 LAB — TYPE AND SCREEN
ABO/RH(D): A POS
ANTIBODY SCREEN: NEGATIVE
UNIT DIVISION: 0

## 2016-10-21 LAB — BPAM RBC
BLOOD PRODUCT EXPIRATION DATE: 201809252359
UNIT TYPE AND RH: 6200

## 2016-10-22 LAB — BLOOD GAS, ARTERIAL
Acid-base deficit: 23.3 mmol/L — ABNORMAL HIGH (ref 0.0–2.0)
BICARBONATE: 9 mmol/L — AB (ref 20.0–28.0)
Bicarbonate: UNDETERMINED mmol/L (ref 20.0–28.0)
FIO2: 0.6
LHR: 18 {breaths}/min
MECHANICAL RATE: 18
MECHVT: 400 mL
MECHVT: 400 mL
O2 SAT: UNDETERMINED %
O2 Saturation: 96.9 %
PATIENT TEMPERATURE: 37
PATIENT TEMPERATURE: 37
PEEP/CPAP: 5 cmH2O
PEEP/CPAP: 5 cmH2O
PH ART: 6.9 — AB (ref 7.350–7.450)
PO2 ART: 141 mmHg — AB (ref 83.0–108.0)
PO2 ART: 66 mmHg — AB (ref 83.0–108.0)
RATE: 18 resp/min
pCO2 arterial: 46 mmHg (ref 32.0–48.0)
pCO2 arterial: 75 mmHg (ref 32.0–48.0)
pH, Arterial: <6.9 — CL (ref 7.350–7.450)

## 2016-10-22 LAB — PREPARE RBC (CROSSMATCH)

## 2016-11-06 NOTE — Progress Notes (Signed)
Nurse paged [facility] to inform that a family member of pt in ICU15 was emotional and could benefit from a [facility] visit. CH visited pt, but the family had just left before Ch arrived. CH will make a follow up visit with pt as needed.    Nov 08, 2016 1151  Clinical Encounter Type  Visited With Patient;Health care provider  Visit Type Follow-up;Other (Comment)  Referral From Nurse  Consult/Referral To Chaplain  Spiritual Encounters  Spiritual Needs Other (Comment)

## 2016-11-06 NOTE — Progress Notes (Signed)
Awaiting ABG lab to be functional to attempt ABG.

## 2016-11-06 NOTE — Progress Notes (Signed)
CH responded to a Code Blue Page for pt in [place]. When [facility] arrived, the medical team was assessing and attending to the pt. Pt was being stabilized but there was no family present at the time. CH provided silent prayer and pastoral presence for pt and for medical team. CH to follow with pt as needed.    Nov 07, 2016 1100  Clinical Encounter Type  Visited With Patient  Visit Type Initial;Code;Other (Comment)  Referral From Nurse  Consult/Referral To Chaplain  Spiritual Encounters  Spiritual Needs Prayer;Emotional;Other (Comment)

## 2016-11-06 NOTE — Progress Notes (Signed)
Patient terminally extubated, patient allowed at bedside once extubated. Vasopressors discontinued per orders.

## 2016-11-06 NOTE — Consult Note (Signed)
Reason for Consult:cardiac arrest  Referring Physician: Dr. Cherlynn Kaiser   CC: cardiac arrest   HPI: Richard Graves is an 66 y.o. male with a known history of EtOH abuse and cocaine abuse who was recently discharged from the hospital for EtOH withdrawal who presents after cardiac arrest. While in hospital pt has had 9 CODEs. On 4 pressors at this time and is full code.    Past Medical History:  Diagnosis Date  . Alcohol abuse   . ETOH abuse   . Hepatitis   . Hypertension     Past Surgical History:  Procedure Laterality Date  . HIP SURGERY      Family History  Problem Relation Age of Onset  . CAD Neg Hx   . Diabetes Neg Hx   . Hypertension Neg Hx     Social History:  reports that he has been smoking.  He has a 53.00 pack-year smoking history. He has never used smokeless tobacco. He reports that he drinks alcohol. He reports that he does not use drugs.  No Known Allergies  Medications: I have reviewed the patient's current medications.  ROS: Unable to obtain as not following commads  Physical Examination: Blood pressure (!) 56/19, pulse (!) 101, temperature 98 F (36.7 C), temperature source Oral, resp. rate 18, SpO2 94 %.  No cough No gag No corneal Negative Doll's eye maneuver No response to cold caloric testing No response painful stimuli Pupils fixes and dilated Not overbreathing the ventilator.    Laboratory Studies:   Basic Metabolic Panel:  Recent Labs Lab 10/21/2016 1933 11/11/16 0500  NA 145 150*  K 5.3* 4.8  CL 110 111  CO2 15* 21*  GLUCOSE 168* 210*  BUN 7 9  CREATININE 1.05 2.04*  CALCIUM 9.2 7.3*  MG 3.0*  --     Liver Function Tests:  Recent Labs Lab 10/18/2016 1933 11-11-16 0500  AST 1,272* 4,468*  ALT 576* 1,247*  ALKPHOS 102 195*  BILITOT 0.5 1.2  PROT 7.1 5.5*  ALBUMIN 2.7* 2.2*   No results for input(s): LIPASE, AMYLASE in the last 168 hours. No results for input(s): AMMONIA in the last 168 hours.  CBC:  Recent Labs Lab  10/22/2016 1933 11/11/2016 0500  WBC 9.8 24.2*  NEUTROABS 6.2  --   HGB 9.9* 9.3*  HCT 33.4* 32.6*  MCV 97.7 97.4  PLT 373 373    Cardiac Enzymes:  Recent Labs Lab 10/22/2016 1933 2016-11-11 0122 November 11, 2016 0500  TROPONINI 0.55* 54.95* >65.00*    BNP: Invalid input(s): POCBNP  CBG:  Recent Labs Lab 10/25/2016 2124 November 11, 2016 0135 11/11/16 0435 11-11-16 0735  GLUCAP 88 124* 191* 115*    Microbiology: Results for orders placed or performed during the hospital encounter of 10/09/2016  MRSA PCR Screening     Status: Abnormal   Collection Time: 10/11/2016  9:47 PM  Result Value Ref Range Status   MRSA by PCR POSITIVE (A) NEGATIVE Final    Comment:        The GeneXpert MRSA Assay (FDA approved for NASAL specimens only), is one component of a comprehensive MRSA colonization surveillance program. It is not intended to diagnose MRSA infection nor to guide or monitor treatment for MRSA infections. RESULT CALLED TO, READ BACK BY AND VERIFIED WITH: CHELSEA WRENN 10/11/2016 @ 2312  MLK     Coagulation Studies:  Recent Labs  10/09/2016 1933  LABPROT 20.8*  INR 1.81    Urinalysis: No results for input(s): COLORURINE, LABSPEC, PHURINE, GLUCOSEU, HGBUR,  BILIRUBINUR, KETONESUR, PROTEINUR, UROBILINOGEN, NITRITE, LEUKOCYTESUR in the last 168 hours.  Invalid input(s): APPERANCEUR  Lipid Panel:  No results found for: CHOL, TRIG, HDL, CHOLHDL, VLDL, LDLCALC  HgbA1C: No results found for: HGBA1C  Urine Drug Screen:     Component Value Date/Time   LABOPIA NEGATIVE 07/09/2013 1934   COCAINSCRNUR NEGATIVE 07/09/2013 1934   LABBENZ NEGATIVE 07/09/2013 1934   AMPHETMU NEGATIVE 07/09/2013 1934   THCU POSITIVE 07/09/2013 1934   LABBARB NEGATIVE 07/09/2013 1934    Alcohol Level:  Recent Labs Lab 11/04/2016 1933  ETH 412*     Imaging: Dg Chest 1 View  Result Date: 11/03/2016 CLINICAL DATA:  Status post central line placement today. EXAM: CHEST 1 VIEW COMPARISON:  Single-view of  the chest earlier today. FINDINGS: New right IJ catheter is in place with the tip near the superior cavoatrial junction. Endotracheal tube is again seen. Defibrillator pads remain in place. No pneumothorax. Mild left basilar atelectasis noted. Heart size is normal. Aortic atherosclerosis is seen. The patient has remote fractures of both clavicles. IMPRESSION: Right IJ catheter tip projects near the superior cavoatrial junction. Negative for pneumothorax. No other change. Electronically Signed   By: Drusilla Kanner M.D.   On: 10/08/2016 23:41   Dg Chest Port 1 View  Result Date: Oct 24, 2016 CLINICAL DATA:  Cardiac arrest EXAM: PORTABLE CHEST 1 VIEW COMPARISON:  11/02/2016 FINDINGS: Right central line and endotracheal tube remain in place, unchanged. Heart is upper limits normal in size with mild vascular congestion and left base atelectasis. No confluent opacity on the right. No effusions. IMPRESSION: Mild vascular congestion. Left base atelectasis. Electronically Signed   By: Charlett Nose M.D.   On: 10-24-16 10:20   Dg Chest Portable 1 View  Result Date: 11/02/2016 CLINICAL DATA:  Cardiac arrest EXAM: PORTABLE CHEST 1 VIEW COMPARISON:  None. FINDINGS: Heart is borderline enlarged with aortic atherosclerosis. Endotracheal tube is noted with tip 5.1 cm above the carina. Atelectasis is seen in the left lower lobe. Acute minimally displaced right lateral sixth rib fracture is identified without pneumothorax. No pleural effusion. Mild vascular congestion is noted. IMPRESSION: 1. Cardiomegaly with aortic atherosclerosis and mild vascular congestion. 2. Endotracheal tube tip in satisfactory position 5.1 cm above the carina. 3. Minimally displaced right lateral sixth rib fracture. Electronically Signed   By: Tollie Eth M.D.   On: 10/11/2016 20:29     Assessment/Plan:  66 y.o. male with a known history of EtOH abuse and cocaine abuse who was recently discharged from the hospital for EtOH withdrawal who  presents after cardiac arrest. While in hospital pt has had 9 CODEs. On 4 pressors at this time and is full code.    Current exam is not compatible with life.   Pt has no brainstem reflexes on initial examination  Would like CTH but don't think pt is stable given 4 pressors and impeding another CODE No recovery from this  Family discussion and likely withdraw care after DNR If family wants aggressive care repeat brain death testing as was done today followed by APNEA test for brain death testing.   Happy to talk to family if needed and make it clear that his current situation is not compatible with life.   2016/10/24, 10:55 AM

## 2016-11-06 NOTE — Progress Notes (Signed)
Pt started on multiple pressors. BP decreasing throughout shift. NP aware. Bicarb gtt started. Critical troponin called to provider, no new orders at this time  Pt HR elevated into 160s sustaining. NP at bedside. Epi gtt stopped, HR regulated after a few minutes. Pt very unstable with deteriorating VS, NP aware at bedside. Pressors maxed out. Pt hooked up to pads and zoll. Pt had agonal beats on monitor around 0530 then asystole. HR not capturing on strip, no pulse. CPR started,code called. Approx 1-2 min CPR. Verbal order to restart epi gtt., and cut off dopamine. Will continue to monitor. Report given to on coming nurse.

## 2016-11-06 NOTE — Progress Notes (Signed)
CH responded to family distress. Pt daughter, former wife, step daugther were crying in hall way. CH escorted them to the room. Daughter wanted time to say goodbye before extubation. CH spent time with family and provided a comforting presence and prayer. Pt to be extubated soon.    10/14/2016 1400  Clinical Encounter Type  Visited With Patient;Patient and family together;Health care provider  Visit Type Initial;Spiritual support;Critical Care;Patient actively dying  Consult/Referral To Chaplain  Spiritual Encounters  Spiritual Needs Prayer;Emotional;Grief support

## 2016-11-06 NOTE — Progress Notes (Signed)
[facility] notified of patient due to GCS of 3. Referral number [id number], spoke to [place]. Will update with status change.

## 2016-11-06 NOTE — Progress Notes (Signed)
Pt maxed out on Levophed and Epi., Pressure decreasing 68/49 (57), NP aware, order to add Neo, currently awaiting from pharmacy. Will continue to monitor.

## 2016-11-06 NOTE — Progress Notes (Signed)
NP notified regarding failed ABG attempts.

## 2016-11-06 NOTE — Progress Notes (Signed)
Spoke with pts daughter Bradlee Heitman she informed me she is the pts only child.  I informed Mrs. Popper her father has coded a total of 9 times and despite aggressive treatment his current status is not compatible with life.  I discussed code status and transitioning pt to comfort care measures only.  His daughter was tearful but would like to change the pts code status to DO NOT RESUSCITATE and transition to comfort measures only.  Sonda Rumble, AGNP  Pulmonary/Critical Care Pager [phone] (please enter 7 digits) PCCM Consult Pager [phone] (please enter 7 digits)

## 2016-11-06 NOTE — Progress Notes (Signed)
Pt. Extubated to room air. 

## 2016-11-06 NOTE — Progress Notes (Signed)
Pt expired 2:43. Seeing the family was strong and secure, I excused myself.

## 2016-11-06 NOTE — Progress Notes (Signed)
Code Blue initiated at 0904, see Code Blue charting form. Will continue to monitor patient.

## 2016-11-06 NOTE — Death Summary Note (Cosign Needed)
DEATH SUMMARY   Patient Details  Name: Richard Graves MRN: [id number] DOB: 1950-02-22  Admission/Discharge Information   Admit Date:  11-Nov-2016  Date of Death:  11-12-2016  Time of Death:  14:43  Length of Stay: 1  Referring Physician: Patient, No Pcp Per   Reason(s) for Hospitalization  Cardiac Arrest  Diagnoses  Preliminary cause of death: Cardiac arrest [facility]) Secondary Diagnoses (including complications and co-morbidities):  Active Problems:   Cardiac arrest [facility])   Cardiopulmonary arrest with successful resuscitation [facility])   Brief Hospital Course (including significant findings, care, treatment, and services provided and events leading to death)  Richard Graves is a 66 y.o. year old male with a PMH of HTN, Hepatitis, Cocaine, and ETOH Abuse. He was recently discharged from the hospital for ETOH withdrawal and presented to [facility] ER 11/12/2022 after being found unresponsive for an unknown period of time.  EMS and first responders were notified and upon their arrival he was pulseless requiring compressions for almost 30 minutes before ROSC and a King airway was placed.  Once he arrived in the hospital on Nov 12, 2022 he coded an additional 8 times lab results revealed troponin >65.00 and ethanol level of 412. He was mechanically intubated in the ER. Despite max doses of neo-synephrine, epinephrine, levophed, and vasopressin gtts as well as a sodium bicarb gtt the pt remained hypotensive.  Neurology assessed pt on Nov 13, 2022 and determined the pt had no brainstem reflexes on initial examination and condition was not compatible with life.  Pts daughter Richard Graves arrived at bedside and decided to change pts code status to DO NOT RESUSCITATE and transition pt to comfort care measures only.  The pt expired on 2016/11/12 at 14:43 with family present at bedside.      Pertinent Labs and Studies  Significant Diagnostic Studies Dg Chest 1 View  Result Date: 11-11-2016 CLINICAL DATA:  Status post central line  placement today. EXAM: CHEST 1 VIEW COMPARISON:  Single-view of the chest earlier today. FINDINGS: New right IJ catheter is in place with the tip near the superior cavoatrial junction. Endotracheal tube is again seen. Defibrillator pads remain in place. No pneumothorax. Mild left basilar atelectasis noted. Heart size is normal. Aortic atherosclerosis is seen. The patient has remote fractures of both clavicles. IMPRESSION: Right IJ catheter tip projects near the superior cavoatrial junction. Negative for pneumothorax. No other change. Electronically Signed   By: Drusilla Kanner M.D.   On: 2016/11/11 23:41   Ct Head Wo Contrast  Result Date: 10/06/2016 CLINICAL DATA:  Larey Seat downstairs. No loss of consciousness. LEFT arm pain. History of hypertension and alcohol abuse. EXAM: CT HEAD WITHOUT CONTRAST CT CERVICAL SPINE WITHOUT CONTRAST TECHNIQUE: Multidetector CT imaging of the head and cervical spine was performed following the standard protocol without intravenous contrast. Multiplanar CT image reconstructions of the cervical spine were also generated. COMPARISON:  CT HEAD September 21, 2016 FINDINGS: CT HEAD FINDINGS BRAIN: No intraparenchymal hemorrhage, mass effect nor midline shift. Moderate to severe parenchymal brain volume loss. No hydrocephalus. Old bilateral basal ganglia infarcts with ex vacuo dilatation LEFT frontal horn of the lateral ventricle. Patchy supratentorial white matter hypodensities unchanged. No abnormal extra-axial fluid collections. VASCULAR: Moderate to severe calcific atherosclerosis of the carotid siphons. SKULL: No skull fracture. Osteopenia. No significant scalp soft tissue swelling. SINUSES/ORBITS: Partially imaged chronic RIGHT maxillary sinusitis with bony remodeling. Small LEFT mastoid effusion. The included ocular globes and orbital contents are non-suspicious. OTHER: None. CT CERVICAL SPINE FINDINGS ALIGNMENT: Maintained lordosis. Vertebral bodies  in alignment. SKULL BASE AND  VERTEBRAE: Cervical vertebral bodies and posterior elements are intact. Severe C5-6 and C6-7 disc height loss with endplate spurring compatible with degenerative discs, moderate C3-4 and C4-5. C1-2 articulation maintained with milder at myopathy. Osteopenia without destructive bony lesions. Multilevel moderate facet arthropathy. Old RIGHT clavicle fracture. SOFT TISSUES AND SPINAL CANAL: Nonacute. Severe RIGHT and moderate LEFT calcific atherosclerosis carotid bifurcations. Dense proteinaceous versus hemorrhagic 7 mm skin nodule RIGHT lower face. DISC LEVELS: No significant osseous canal stenosis. Moderate C4-5 through C6-7 neural foraminal narrowing. UPPER CHEST: Biapical emphysema. OTHER: None. IMPRESSION: CT HEAD: 1. No acute intracranial process. 2. Stable moderate to severe atrophy. 3. 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 CT CERVICAL SPINE: 1. No acute fracture or malalignment. 2. Advanced atherosclerosis carotid bifurcations may result in hemodynamically significant stenosis. Consider carotid ultrasound versus CT angiogram of the neck on a nonemergent basis. Electronically Signed   By: Awilda Metro M.D.   On: 10/06/2016 04:54   Ct Head Wo Contrast  Result Date: 09/21/2016 CLINICAL DATA:  Patient found lying in ditch. Incontinence. Acute onset of altered level of consciousness. Initial encounter. EXAM: CT HEAD WITHOUT CONTRAST TECHNIQUE: Contiguous axial images were obtained from the base of the skull through the vertex without intravenous contrast. COMPARISON:  CT of the head performed 01/07/2016 FINDINGS: Brain: No evidence of acute infarction, hemorrhage, hydrocephalus, extra-axial collection or mass lesion/mass effect. Prominence of the ventricles and sulci reflects moderate cortical volume loss. Cerebellar atrophy is noted. Scattered periventricular and subcortical white matter change likely reflects small vessel ischemic microangiopathy. Chronic lacunar  infarcts are noted at the basal ganglia bilaterally. The brainstem and fourth ventricle are within normal limits. The cerebral hemispheres demonstrate grossly normal gray-white differentiation. No mass effect or midline shift is seen. Vascular: No hyperdense vessel or unexpected calcification. Skull: There is no evidence of fracture; visualized osseous structures are unremarkable in appearance. Sinuses/Orbits: The visualized portions of the orbits are within normal limits. Mucoperiosteal thickening is noted at the right maxillary sinus. There is mild partial opacification of the left mastoid air cells. The remaining visualized paranasal sinuses and right mastoid air cells are well-aerated. Other: No significant soft tissue abnormalities are seen. IMPRESSION: 1. No acute intracranial pathology seen on CT. 2. Moderate cortical volume loss and scattered small vessel ischemic microangiopathy. 3. 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 4. Mucoperiosteal thickening at the right maxillary sinus, and mild partial opacification of the left mastoid air cells. Electronically Signed   By: Roanna Raider M.D.   On: 09/21/2016 21:09   Ct Cervical Spine Wo Contrast  Result Date: 10/06/2016 CLINICAL DATA:  Larey Seat downstairs. No loss of consciousness. LEFT arm pain. History of hypertension and alcohol abuse. EXAM: CT HEAD WITHOUT CONTRAST CT CERVICAL SPINE WITHOUT CONTRAST TECHNIQUE: Multidetector CT imaging of the head and cervical spine was performed following the standard protocol without intravenous contrast. Multiplanar CT image reconstructions of the cervical spine were also generated. COMPARISON:  CT HEAD September 21, 2016 FINDINGS: CT HEAD FINDINGS BRAIN: No intraparenchymal hemorrhage, mass effect nor midline shift. Moderate to severe parenchymal brain volume loss. No hydrocephalus. Old bilateral basal ganglia infarcts with ex vacuo dilatation LEFT frontal horn of the lateral ventricle. Patchy  supratentorial white matter hypodensities unchanged. No abnormal extra-axial fluid collections. VASCULAR: Moderate to severe calcific atherosclerosis of the carotid siphons. SKULL: No skull fracture. Osteopenia. No significant scalp soft tissue swelling. SINUSES/ORBITS: Partially imaged chronic RIGHT maxillary sinusitis with bony remodeling.  Small LEFT mastoid effusion. The included ocular globes and orbital contents are non-suspicious. OTHER: None. CT CERVICAL SPINE FINDINGS ALIGNMENT: Maintained lordosis. Vertebral bodies in alignment. SKULL BASE AND VERTEBRAE: Cervical vertebral bodies and posterior elements are intact. Severe C5-6 and C6-7 disc height loss with endplate spurring compatible with degenerative discs, moderate C3-4 and C4-5. C1-2 articulation maintained with milder at myopathy. Osteopenia without destructive bony lesions. Multilevel moderate facet arthropathy. Old RIGHT clavicle fracture. SOFT TISSUES AND SPINAL CANAL: Nonacute. Severe RIGHT and moderate LEFT calcific atherosclerosis carotid bifurcations. Dense proteinaceous versus hemorrhagic 7 mm skin nodule RIGHT lower face. DISC LEVELS: No significant osseous canal stenosis. Moderate C4-5 through C6-7 neural foraminal narrowing. UPPER CHEST: Biapical emphysema. OTHER: None. IMPRESSION: CT HEAD: 1. No acute intracranial process. 2. Stable moderate to severe atrophy. 3. 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 CT CERVICAL SPINE: 1. No acute fracture or malalignment. 2. Advanced atherosclerosis carotid bifurcations may result in hemodynamically significant stenosis. Consider carotid ultrasound versus CT angiogram of the neck on a nonemergent basis. Electronically Signed   By: Awilda Metro M.D.   On: 10/06/2016 04:54   Dg Chest Port 1 View  Result Date: November 01, 2016 CLINICAL DATA:  Cardiac arrest EXAM: PORTABLE CHEST 1 VIEW COMPARISON:  11/01/2016 FINDINGS: Right central line and endotracheal tube remain  in place, unchanged. Heart is upper limits normal in size with mild vascular congestion and left base atelectasis. No confluent opacity on the right. No effusions. IMPRESSION: Mild vascular congestion. Left base atelectasis. Electronically Signed   By: Charlett Nose M.D.   On: 11-01-2016 10:20   Dg Chest Portable 1 View  Result Date: 10/22/2016 CLINICAL DATA:  Cardiac arrest EXAM: PORTABLE CHEST 1 VIEW COMPARISON:  None. FINDINGS: Heart is borderline enlarged with aortic atherosclerosis. Endotracheal tube is noted with tip 5.1 cm above the carina. Atelectasis is seen in the left lower lobe. Acute minimally displaced right lateral sixth rib fracture is identified without pneumothorax. No pleural effusion. Mild vascular congestion is noted. IMPRESSION: 1. Cardiomegaly with aortic atherosclerosis and mild vascular congestion. 2. Endotracheal tube tip in satisfactory position 5.1 cm above the carina. 3. Minimally displaced right lateral sixth rib fracture. Electronically Signed   By: Tollie Eth M.D.   On: 11/04/2016 20:29   Dg Shoulder Left  Result Date: 10/06/2016 CLINICAL DATA:  Ecchymosis and deformity after falling on stairs tonight EXAM: LEFT SHOULDER - 2+ VIEW COMPARISON:  None. FINDINGS: There is a proximal left humeral fracture with marked anterior displacement and mild comminution. The humeral head fragment is rotated but probably remains in contact with the glenoid. Multiple old left rib fractures. Irregularity of the distal clavicle is probably due to remote healed fracture. IMPRESSION: Comminuted displaced proximal left humeral fracture. Electronically Signed   By: Ellery Plunk M.D.   On: 10/06/2016 02:56   Dg Humerus Left  Result Date: 10/06/2016 CLINICAL DATA:  Ecchymosis and deformity after falling on stairs tonight. EXAM: LEFT HUMERUS - 2+ VIEW COMPARISON:  None. FINDINGS: There is an acute proximal left humeral fracture with marked anterior displacement and mild comminution. The humeral  head fragment is rotated although it does appear to still be in contact with the glenoid. No evidence of a pathologic basis for the fracture. IMPRESSION: Comminuted markedly displaced proximal left humeral fracture. Remainder of the humerus appears intact. Electronically Signed   By: Ellery Plunk M.D.   On: 10/06/2016 02:56    Microbiology Recent Results (from the past 240 hour(s))  MRSA  PCR Screening     Status: Abnormal   Collection Time: 11/03/2016  9:47 PM  Result Value Ref Range Status   MRSA by PCR POSITIVE (A) NEGATIVE Final    Comment:        The GeneXpert MRSA Assay (FDA approved for NASAL specimens only), is one component of a comprehensive MRSA colonization surveillance program. It is not intended to diagnose MRSA infection nor to guide or monitor treatment for MRSA infections. RESULT CALLED TO, READ BACK BY AND VERIFIED WITH: CHELSEA WRENN 2016-11-03 @ 2312  MLK     Lab Basic Metabolic Panel:  Recent Labs Lab 11-03-2016 1933 10/29/2016 0500 10/29/2016 1042  NA 145 150* 157*  K 5.3* 4.8 4.8  CL 110 111 108  CO2 15* 21* 24  GLUCOSE 168* 210* 118*  BUN CREATININE 1.05 2.04* 2.60*  CALCIUM 9.2 7.3* 6.4*  MG 3.0*  --   --    Liver Function Tests:  Recent Labs Lab November 03, 2016 1933 10/30/2016 0500  AST 1,272* 4,468*  ALT 576* 1,247*  ALKPHOS 102 195*  BILITOT 0.5 1.2  PROT 7.1 5.5*  ALBUMIN 2.7* 2.2*   No results for input(s): LIPASE, AMYLASE in the last 168 hours. No results for input(s): AMMONIA in the last 168 hours. CBC:  Recent Labs Lab 11-03-16 1933 10/11/2016 0500 10/08/2016 1042  WBC 9.8 24.2* 17.2*  NEUTROABS 6.2  --   --   HGB 9.9* 9.3* 6.9*  HCT 33.4* 32.6* 24.6*  MCV 97.7 97.4 99.6  PLT 373 373 209   Cardiac Enzymes:  Recent Labs Lab November 03, 2016 1933 11/02/2016 0122 10/23/2016 0500 11/05/2016 1042  TROPONINI 0.55* 54.95* >65.00* >65.00*   Sepsis Labs:  Recent Labs Lab 11/03/2016 1933 10/31/2016 0005 10/29/2016 0122 10/24/2016 0500  11/03/2016 1042 10/30/2016 1057  PROCALCITON  --   --  1.40  --   --   --   WBC 9.8  --   --  24.2* 17.2*  --   LATICACIDVEN 14.6* 13.7*  --   --   --  2.1*    Procedures/Operations  9/13 Endotracheal Tube 9/13 Right Internal Jugular Central Line

## 2016-11-06 NOTE — Progress Notes (Signed)
NP verbal to wean off dopamine. Critical lactic and trop communicated to NP, no new orders at this time. Still not able to place foley cath, NP also aware

## 2016-11-06 NOTE — Progress Notes (Signed)
Unable to obtain abg due to low bp

## 2016-11-06 DEATH — deceased

## 2018-02-24 IMAGING — CT CT CERVICAL SPINE W/O CM
4 of 7 series · 13 of 33 positions shown, 14 images · non-contrast
Comparison: CT HEAD September 21, 2016

CLINICAL DATA: Fell downstairs. No loss of consciousness. LEFT arm
pain. History of hypertension and alcohol abuse.

EXAM:
CT HEAD WITHOUT CONTRAST
CT CERVICAL SPINE WITHOUT CONTRAST
TECHNIQUE: Multidetector CT imaging of the head and cervical spine was
performed following the standard protocol without intravenous
contrast. Multiplanar CT image reconstructions of the cervical spine
were also generated.

[Series 5: c spine soft · axial · 0.31mm/px · z∈[-172,-118]mm · 3 of 68 slices shown]
[im 14/68  soft-tissue]
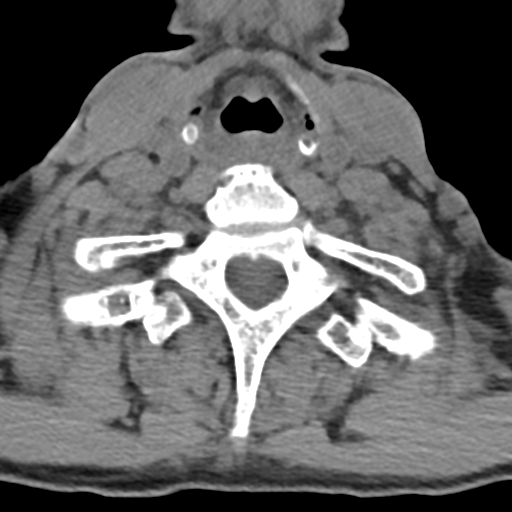
[im 27/68  soft-tissue]
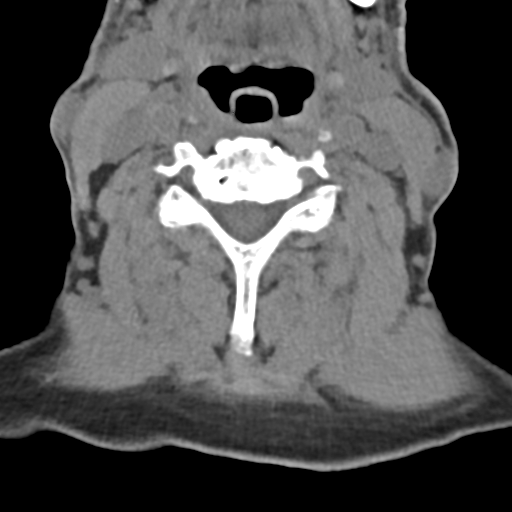
[im 41/68  soft-tissue]
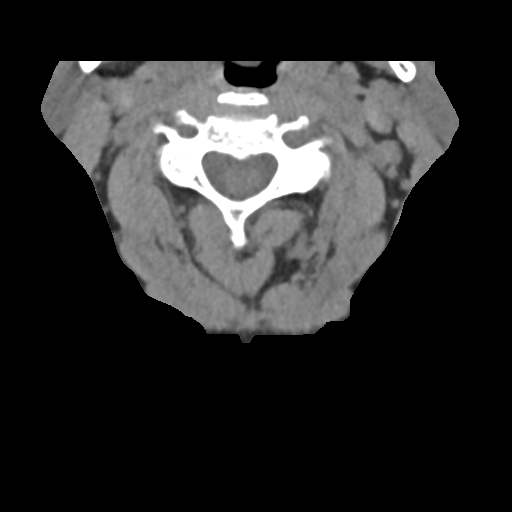

[Series 6: coronal soft tissue · coronal · 0.31mm/px · 2 of 61 slices shown]
[im 21/61  bone]
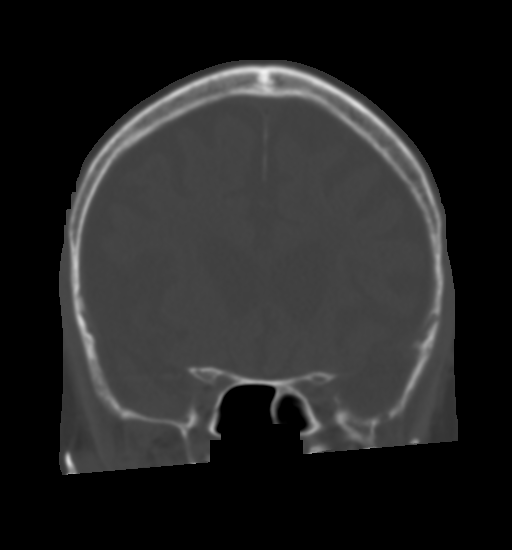
[im 41/61  bone]
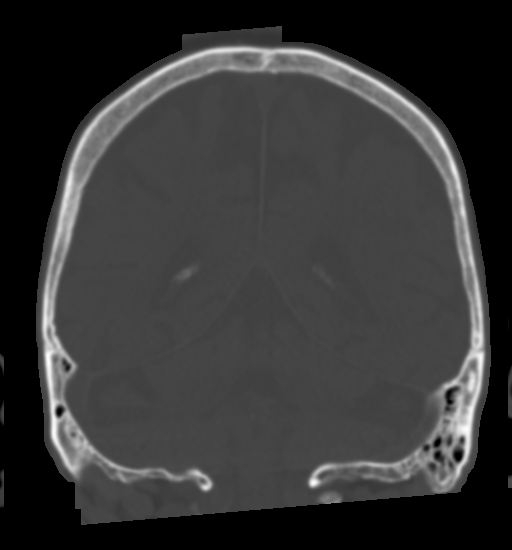

[Series 8: sagittal bone · sagittal · 0.20mm/px · 4 of 43 slices shown]
[im 9/43  bone]
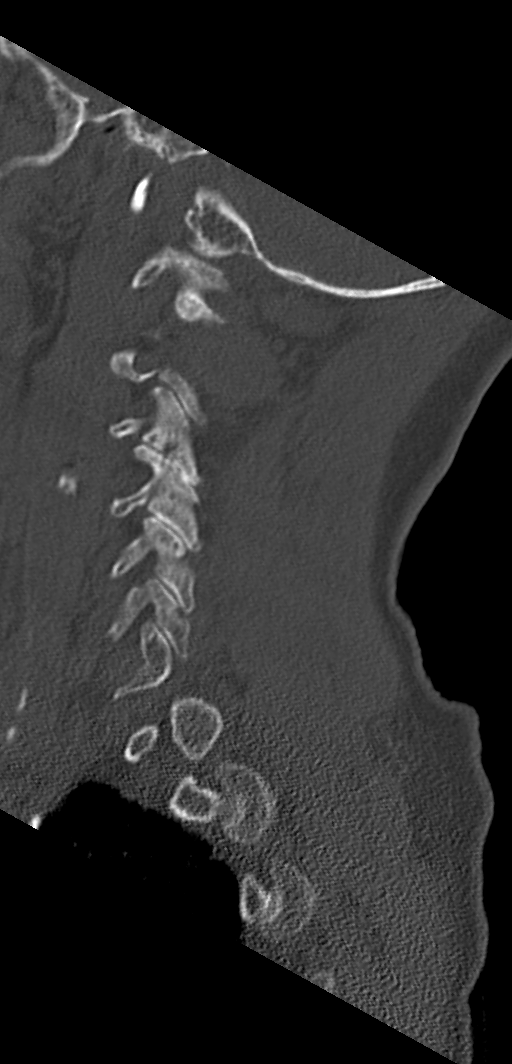
[im 17/43  bone]
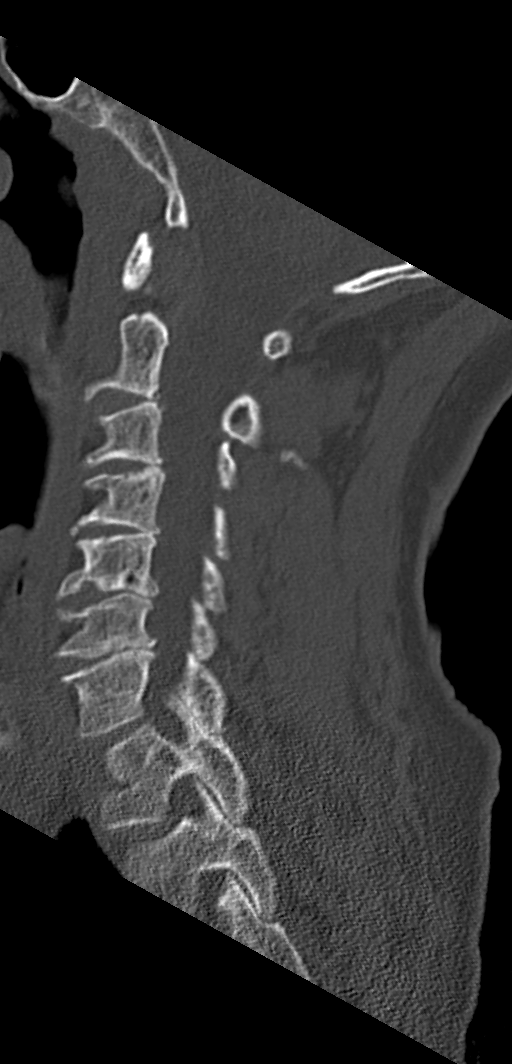
[im 26/43  bone]
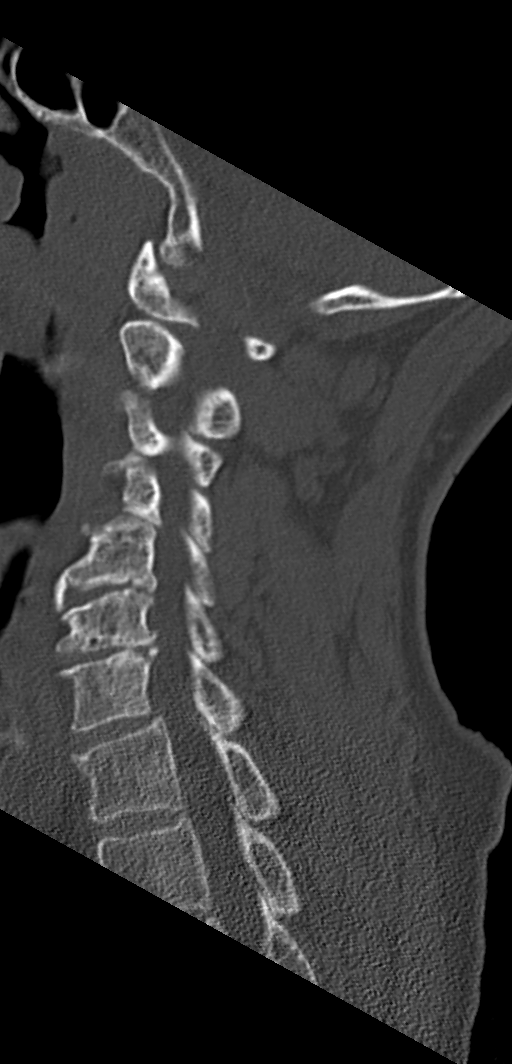
[im 34/43  bone]
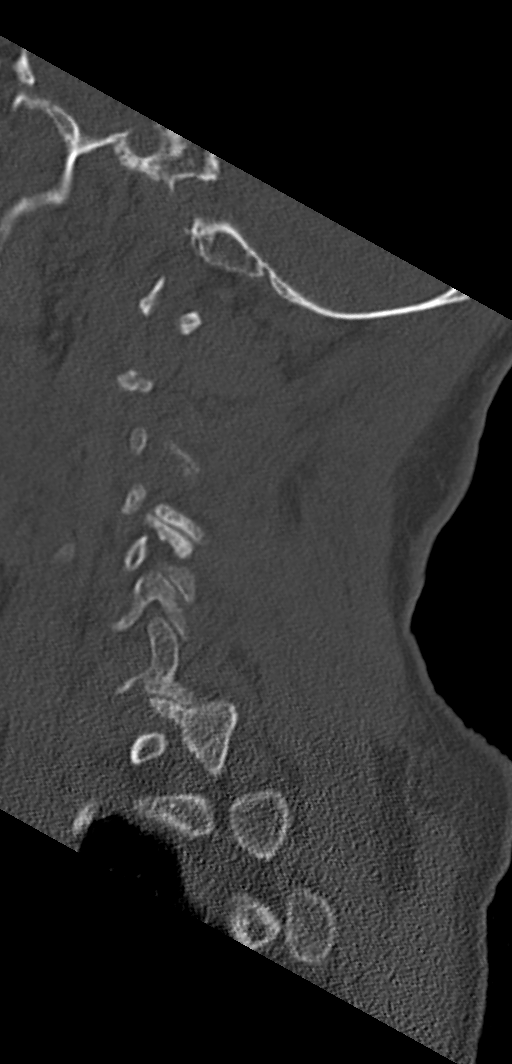

[Series 10: orthogonal bone · axial · 0.23mm/px · z∈[-194,-120]mm · 4 of 73 slices shown, 5 images]
[im 15/73  soft-tissue]
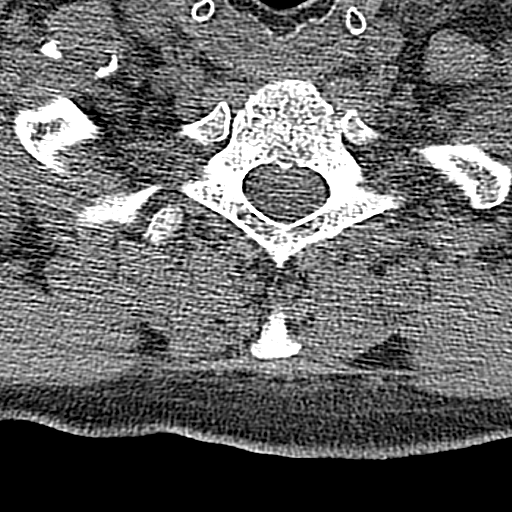
[im 15/73  bone]
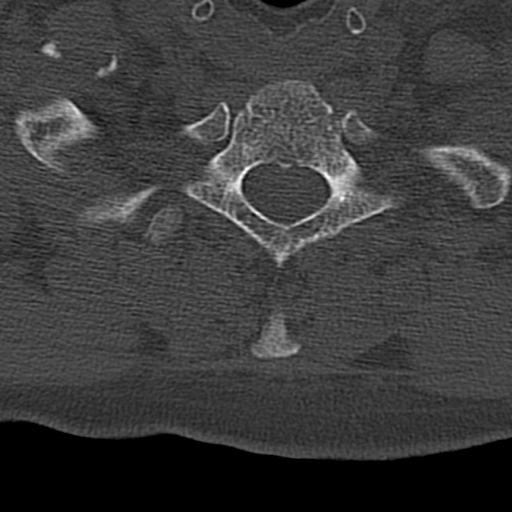
[im 29/73  bone]
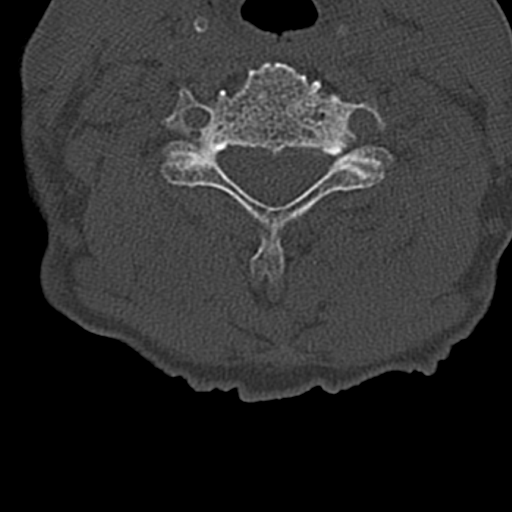
[im 44/73  bone]
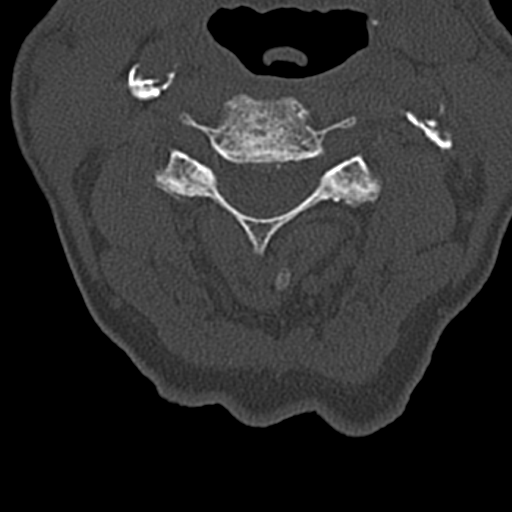
[im 58/73  bone]
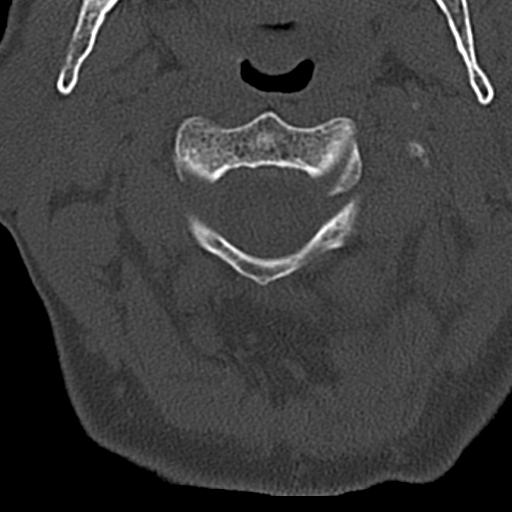

[13 of 33 positions shown; findings below may reference images not displayed]

FINDINGS: CT HEAD FINDINGS

BRAIN: No intraparenchymal hemorrhage, mass effect nor midline
shift. Moderate to severe parenchymal brain volume loss. No
hydrocephalus. Old bilateral basal ganglia infarcts with ex vacuo
dilatation LEFT frontal horn of the lateral ventricle. Patchy
supratentorial white matter hypodensities unchanged. No abnormal
extra-axial fluid collections.

VASCULAR: Moderate to severe calcific atherosclerosis of the carotid
siphons.

SKULL: No skull fracture. Osteopenia. No significant scalp soft
tissue swelling.

SINUSES/ORBITS: Partially imaged chronic RIGHT maxillary sinusitis
with bony remodeling. Small LEFT mastoid effusion. The included
ocular globes and orbital contents are non-suspicious.

OTHER: None.

CT CERVICAL SPINE FINDINGS

ALIGNMENT: Maintained lordosis. Vertebral bodies in alignment.

SKULL BASE AND VERTEBRAE: Cervical vertebral bodies and posterior
elements are intact. Severe C5-6 and C6-7 disc height loss with
endplate spurring compatible with degenerative discs, moderate C3-4
and C4-5. C1-2 articulation maintained with milder at myopathy.
Osteopenia without destructive bony lesions. Multilevel moderate
facet arthropathy. Old RIGHT clavicle fracture.

SOFT TISSUES AND SPINAL CANAL: Nonacute. Severe RIGHT and moderate
LEFT calcific atherosclerosis carotid bifurcations. Dense
proteinaceous versus hemorrhagic 7 mm skin nodule RIGHT lower face.

DISC LEVELS: No significant osseous canal stenosis. Moderate C4-5
through C6-7 neural foraminal narrowing.

UPPER CHEST: Biapical emphysema.

OTHER: None.
IMPRESSION: CT HEAD:

1. No acute intracranial process.
2. Stable moderate to severe atrophy.
3. Moderate chronic small vessel ischemic disease and old basal
ganglia infarcts.
CT CERVICAL SPINE:

1. No acute fracture or malalignment.
2. Advanced atherosclerosis carotid bifurcations may result in
hemodynamically significant stenosis. Consider carotid ultrasound
versus CT angiogram of the neck on a nonemergent basis.
